# Patient Record
Sex: Male | Born: 1985 | Race: White | Hispanic: No | Marital: Married
Health system: Southern US, Community
[De-identification: ages and names within clinical notes are randomized; demographics above are authoritative.]

## PROBLEM LIST (undated history)

## (undated) DIAGNOSIS — K219 Gastro-esophageal reflux disease without esophagitis: Secondary | ICD-10-CM

## (undated) DIAGNOSIS — Z8669 Personal history of other diseases of the nervous system and sense organs: Secondary | ICD-10-CM

## (undated) HISTORY — PX: FINGER FRACTURE SURGERY: SHX638

## (undated) HISTORY — PX: PILONIDAL CYST EXCISION: SHX744

---
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
## 2006-02-11 ENCOUNTER — Emergency Department (HOSPITAL_COMMUNITY): Admission: EM | Admit: 2006-02-11 | Discharge: 2006-02-12 | Payer: Self-pay | Admitting: Emergency Medicine

## 2006-08-22 ENCOUNTER — Emergency Department (HOSPITAL_COMMUNITY): Admission: EM | Admit: 2006-08-22 | Discharge: 2006-08-22 | Payer: Self-pay | Admitting: Physician Assistant

## 2006-08-27 ENCOUNTER — Emergency Department (HOSPITAL_COMMUNITY): Admission: EM | Admit: 2006-08-27 | Discharge: 2006-08-28 | Payer: Self-pay | Admitting: Emergency Medicine

## 2006-09-05 ENCOUNTER — Emergency Department (HOSPITAL_COMMUNITY): Admission: EM | Admit: 2006-09-05 | Discharge: 2006-09-05 | Payer: Self-pay | Admitting: Emergency Medicine

## 2007-09-06 ENCOUNTER — Emergency Department (HOSPITAL_COMMUNITY): Admission: EM | Admit: 2007-09-06 | Discharge: 2007-09-06 | Payer: Self-pay | Admitting: Emergency Medicine

## 2007-12-13 ENCOUNTER — Emergency Department (HOSPITAL_COMMUNITY): Admission: EM | Admit: 2007-12-13 | Discharge: 2007-12-13 | Payer: Self-pay | Admitting: Emergency Medicine

## 2008-04-20 ENCOUNTER — Emergency Department (HOSPITAL_COMMUNITY): Admission: EM | Admit: 2008-04-20 | Discharge: 2008-04-20 | Payer: Self-pay | Admitting: Emergency Medicine

## 2008-05-11 ENCOUNTER — Emergency Department (HOSPITAL_COMMUNITY): Admission: EM | Admit: 2008-05-11 | Discharge: 2008-05-11 | Payer: Self-pay | Admitting: Emergency Medicine

## 2008-11-05 ENCOUNTER — Emergency Department (HOSPITAL_COMMUNITY): Admission: EM | Admit: 2008-11-05 | Discharge: 2008-11-05 | Payer: Self-pay | Admitting: Emergency Medicine

## 2008-12-28 ENCOUNTER — Emergency Department (HOSPITAL_COMMUNITY): Admission: EM | Admit: 2008-12-28 | Discharge: 2008-12-28 | Payer: Self-pay | Admitting: Emergency Medicine

## 2009-03-08 ENCOUNTER — Emergency Department (HOSPITAL_COMMUNITY): Admission: EM | Admit: 2009-03-08 | Discharge: 2009-03-08 | Payer: Self-pay | Admitting: Emergency Medicine

## 2009-09-26 ENCOUNTER — Emergency Department (HOSPITAL_COMMUNITY): Admission: EM | Admit: 2009-09-26 | Discharge: 2009-09-26 | Payer: Self-pay | Admitting: Emergency Medicine

## 2009-09-28 ENCOUNTER — Ambulatory Visit (HOSPITAL_COMMUNITY): Admission: RE | Admit: 2009-09-28 | Discharge: 2009-09-28 | Payer: Self-pay | Admitting: General Surgery

## 2010-07-22 LAB — BASIC METABOLIC PANEL
CO2: 25 mEq/L (ref 19–32)
Glucose, Bld: 97 mg/dL (ref 70–99)
Potassium: 4.2 mEq/L (ref 3.5–5.1)
Sodium: 139 mEq/L (ref 135–145)

## 2010-07-22 LAB — DIFFERENTIAL
Band Neutrophils: 2 % (ref 0–10)
Blasts: 0 %
Metamyelocytes Relative: 0 %
Promyelocytes Absolute: 0 %

## 2010-07-22 LAB — CBC
HCT: 45.5 % (ref 39.0–52.0)
Hemoglobin: 14.9 g/dL (ref 13.0–17.0)
MCHC: 32.8 g/dL (ref 30.0–36.0)
RDW: 13.8 % (ref 11.5–15.5)

## 2010-08-11 LAB — DIFFERENTIAL
Basophils Absolute: 0 10*3/uL (ref 0.0–0.1)
Basophils Relative: 1 % (ref 0–1)
Eosinophils Absolute: 0 10*3/uL (ref 0.0–0.7)
Eosinophils Relative: 1 % (ref 0–5)
Lymphocytes Relative: 20 % (ref 12–46)

## 2010-08-11 LAB — COMPREHENSIVE METABOLIC PANEL
AST: 20 U/L (ref 0–37)
Alkaline Phosphatase: 72 U/L (ref 39–117)
BUN: 13 mg/dL (ref 6–23)
CO2: 29 mEq/L (ref 19–32)
Chloride: 106 mEq/L (ref 96–112)
Creatinine, Ser: 0.95 mg/dL (ref 0.4–1.5)
GFR calc Af Amer: >60 mL/min (ref >60)
GFR calc non Af Amer: >60 mL/min (ref >60)
Total Bilirubin: 0.5 mg/dL (ref 0.3–1.2)

## 2010-08-11 LAB — POCT CARDIAC MARKERS: Troponin i, poc: <0.05 ng/mL (ref 0.00–0.09)

## 2010-08-11 LAB — CBC
HCT: 42.7 % (ref 39.0–52.0)
MCV: 85.4 fL (ref 78.0–100.0)
RBC: 5 MIL/uL (ref 4.22–5.81)
WBC: 6.9 10*3/uL (ref 4.0–10.5)

## 2010-08-19 LAB — BASIC METABOLIC PANEL
Chloride: 104 mEq/L (ref 96–112)
GFR calc Af Amer: >60 mL/min (ref >60)
Potassium: 3.3 mEq/L — ABNORMAL LOW (ref 3.5–5.1)
Sodium: 138 mEq/L (ref 135–145)

## 2010-08-19 LAB — URINALYSIS, ROUTINE W REFLEX MICROSCOPIC
Bilirubin Urine: NEGATIVE
Glucose, UA: NEGATIVE mg/dL
Ketones, ur: NEGATIVE mg/dL
pH: 5.5 (ref 5.0–8.0)

## 2010-08-19 LAB — URINE MICROSCOPIC-ADD ON

## 2010-08-19 LAB — DIFFERENTIAL
Basophils Absolute: 0.1 10*3/uL (ref 0.0–0.1)
Basophils Relative: 1 % (ref 0–1)
Monocytes Absolute: 0.5 10*3/uL (ref 0.1–1.0)
Neutro Abs: 4.9 10*3/uL (ref 1.7–7.7)
Neutrophils Relative %: 66 % (ref 43–77)

## 2010-08-19 LAB — CBC
Hemoglobin: 14.8 g/dL (ref 13.0–17.0)
MCHC: 33.6 g/dL (ref 30.0–36.0)
Platelets: 177 10*3/uL (ref 150–400)
RDW: 13.1 % (ref 11.5–15.5)

## 2011-05-26 ENCOUNTER — Emergency Department (HOSPITAL_COMMUNITY)
Admission: EM | Admit: 2011-05-26 | Discharge: 2011-05-26 | Disposition: A | Discharge status: Home / Self Care | Payer: Self-pay | Attending: Emergency Medicine | Admitting: Emergency Medicine

## 2011-05-26 ENCOUNTER — Encounter (HOSPITAL_COMMUNITY): Payer: Self-pay

## 2011-05-26 ENCOUNTER — Emergency Department (HOSPITAL_COMMUNITY): Payer: Self-pay

## 2011-05-26 DIAGNOSIS — J45901 Unspecified asthma with (acute) exacerbation: Secondary | ICD-10-CM | POA: Insufficient documentation

## 2011-05-26 DIAGNOSIS — F17200 Nicotine dependence, unspecified, uncomplicated: Secondary | ICD-10-CM | POA: Insufficient documentation

## 2011-05-26 DIAGNOSIS — J069 Acute upper respiratory infection, unspecified: Secondary | ICD-10-CM | POA: Insufficient documentation

## 2011-05-26 MED ORDER — PREDNISONE 20 MG PO TABS
60.0000 mg | ORAL_TABLET | Freq: Once | ORAL | Status: AC
  Administered 2011-05-26: 60 mg via ORAL
  Filled 2011-05-26: qty 3

## 2011-05-26 MED ORDER — PREDNISONE 50 MG PO TABS: ORAL_TABLET | ORAL | Status: AC

## 2011-05-26 MED ORDER — ALBUTEROL SULFATE HFA 108 (90 BASE) MCG/ACT IN AERS
2.0000 | INHALATION_SPRAY | RESPIRATORY_TRACT | Status: DC | PRN
  Administered 2011-05-26: 2 via RESPIRATORY_TRACT
  Filled 2011-05-26 (×2): qty 6.7

## 2011-05-26 MED ORDER — ALBUTEROL SULFATE (5 MG/ML) 0.5% IN NEBU
2.5000 mg | INHALATION_SOLUTION | Freq: Once | RESPIRATORY_TRACT | Status: AC
  Administered 2011-05-26: 2.5 mg via RESPIRATORY_TRACT
  Filled 2011-05-26: qty 0.5

## 2011-05-26 NOTE — ED Provider Notes (Signed)
History     CSN: [id number]  Arrival date & time 05/26/11  1722   First MD Initiated Contact with Patient 05/26/11 1747      Chief Complaint  Patient presents with  . Cough    (Consider location/radiation/quality/duration/timing/severity/associated sxs/prior treatment) Patient is a 26 y.o. male presenting with cough. The history is provided by the patient and a parent.  Cough This is a new problem. The current episode started yesterday. The problem occurs every few minutes. The problem has not changed since onset.The cough is productive of sputum. There has been no fever. Associated symptoms include wheezing. Pertinent negatives include no chills and no shortness of breath. Treatments tried: nyquil. The treatment provided no relief. He is a smoker. His past medical history is significant for bronchitis and asthma.    Past Medical History  Diagnosis Date  . Asthma     Past Surgical History  Procedure Date  . Pilonidal cyst excision     No family history on file.  History  Substance Use Topics  . Smoking status: Current Everyday Smoker  . Smokeless tobacco: Not on file  . Alcohol Use: No      Review of Systems  Constitutional: Negative for fever and chills.  Respiratory: Positive for cough and wheezing. Negative for shortness of breath.     Allergies  Review of patient's allergies indicates no known allergies.  Home Medications  No current outpatient prescriptions on file.  BP 126/91  Pulse 108  Temp(Src) 98.3 F (36.8 C) (Oral)  Resp 20  Ht 6\' 1"  (1.854 m)  Wt 189 lb (85.73 kg)  BMI 24.94 kg/m2  SpO2 99%  Physical Exam  Nursing note and vitals reviewed. Constitutional: He is oriented to person, place, and time. He appears well-developed and well-nourished. He is cooperative.  Non-toxic appearance. He does not have a sickly appearance. He does not appear ill. No distress.  HENT:  Head: Normocephalic and atraumatic.  Right Ear: External ear normal.    Left Ear: External ear normal.  Mouth/Throat: No oropharyngeal exudate.  Eyes: EOM are normal.  Neck: Normal range of motion.  Cardiovascular: Normal rate, regular rhythm, normal heart sounds and intact distal pulses.   Pulmonary/Chest: Effort normal and breath sounds normal. No accessory muscle usage. Not tachypneic. No respiratory distress. He has no decreased breath sounds. He has no wheezes. He has no rhonchi. He has no rales.  Abdominal: Soft. He exhibits no distension. There is no tenderness.  Musculoskeletal: Normal range of motion.  Neurological: He is alert and oriented to person, place, and time.  Skin: Skin is warm and dry.  Psychiatric: He has a normal mood and affect. Judgment normal.    ED Course  Procedures (including critical care time)  Labs Reviewed - No data to display No results found.   No diagnosis found.    MDM          Worthy Rancher, PA 05/26/11 1759

## 2011-05-26 NOTE — ED Provider Notes (Signed)
Medical screening examination/treatment/procedure(s) were performed by non-physician practitioner and as supervising physician I was immediately available for consultation/collaboration.  Hazely Sealey R. Terrence Wishon, MD 05/26/11 2334 

## 2011-05-26 NOTE — ED Notes (Signed)
Pt c/o cold symptoms since yesterday.  Denies fever.

## 2011-07-29 ENCOUNTER — Encounter (HOSPITAL_COMMUNITY): Payer: Self-pay | Admitting: *Deleted

## 2011-07-29 ENCOUNTER — Emergency Department (HOSPITAL_COMMUNITY)
Admission: EM | Admit: 2011-07-29 | Discharge: 2011-07-29 | Disposition: A | Discharge status: Home / Self Care | Payer: Self-pay | Attending: Emergency Medicine | Admitting: Emergency Medicine

## 2011-07-29 ENCOUNTER — Emergency Department (HOSPITAL_COMMUNITY): Payer: Self-pay

## 2011-07-29 DIAGNOSIS — J45909 Unspecified asthma, uncomplicated: Secondary | ICD-10-CM | POA: Insufficient documentation

## 2011-07-29 DIAGNOSIS — R059 Cough, unspecified: Secondary | ICD-10-CM | POA: Insufficient documentation

## 2011-07-29 DIAGNOSIS — R509 Fever, unspecified: Secondary | ICD-10-CM | POA: Insufficient documentation

## 2011-07-29 DIAGNOSIS — J189 Pneumonia, unspecified organism: Secondary | ICD-10-CM | POA: Insufficient documentation

## 2011-07-29 DIAGNOSIS — R05 Cough: Secondary | ICD-10-CM | POA: Insufficient documentation

## 2011-07-29 MED ORDER — GUAIFENESIN-CODEINE 100-10 MG/5ML PO SYRP: ORAL_SOLUTION | ORAL | Status: DC

## 2011-07-29 MED ORDER — AZITHROMYCIN 250 MG PO TABS
500.0000 mg | ORAL_TABLET | Freq: Once | ORAL | Status: AC
  Administered 2011-07-29: 500 mg via ORAL
  Filled 2011-07-29: qty 2

## 2011-07-29 MED ORDER — AZITHROMYCIN 250 MG PO TABS: ORAL_TABLET | ORAL | Status: DC

## 2011-07-29 MED ORDER — LIDOCAINE HCL (PF) 1 % IJ SOLN
INTRAMUSCULAR | Status: AC
  Administered 2011-07-29: 23:00:00
  Filled 2011-07-29: qty 5

## 2011-07-29 MED ORDER — IBUPROFEN 800 MG PO TABS
800.0000 mg | ORAL_TABLET | Freq: Once | ORAL | Status: AC
  Administered 2011-07-29: 800 mg via ORAL
  Filled 2011-07-29: qty 1

## 2011-07-29 MED ORDER — CEFTRIAXONE SODIUM 1 G IJ SOLR
1.0000 g | Freq: Once | INTRAMUSCULAR | Status: AC
  Administered 2011-07-29: 1 g via INTRAMUSCULAR
  Filled 2011-07-29: qty 10

## 2011-07-29 NOTE — ED Provider Notes (Signed)
History     CSN: [id number]  Arrival date & time 07/29/11  2056   None     Chief Complaint  Patient presents with  . Fever    today  . Cough    since Friday    (Consider location/radiation/quality/duration/timing/severity/associated sxs/prior treatment) HPI Comments: Tmax 104 today.  Cough prod of yellowish sputum.  Pt of dr. Milford Cage.  Patient is a 26 y.o. male presenting with fever and cough. The history is provided by the patient. No language interpreter was used.  Fever Primary symptoms of the febrile illness include fever and cough. The current episode started 3 to 5 days ago. This is a new problem. The problem has not changed since onset. Cough    Past Medical History  Diagnosis Date  . Asthma     Past Surgical History  Procedure Date  . Pilonidal cyst excision     History reviewed. No pertinent family history.  History  Substance Use Topics  . Smoking status: Current Everyday Smoker -- 1.5 packs/day    Types: Cigarettes  . Smokeless tobacco: Not on file  . Alcohol Use: No      Review of Systems  Constitutional: Positive for fever.  Respiratory: Positive for cough.   All other systems reviewed and are negative.    Allergies  Review of patient's allergies indicates no known allergies.  Home Medications   Current Outpatient Rx  Name Route Sig Dispense Refill  . DM-PHENYLEPHRINE-ACETAMINOPHEN 10-5-325 MG/15ML PO LIQD Oral Take 30 mLs by mouth 3 (three) times daily as needed. For cold symptoms    . AZITHROMYCIN 250 MG PO TABS  One tab po QD (initial dose given in the ED) 4 tablet 0  . GUAIFENESIN-CODEINE 100-10 MG/5ML PO SYRP  10 ml q 4-6 hrs prn cough 240 mL 0    BP 126/72  Pulse 108  Temp(Src) 100.8 F (38.2 C) (Oral)  Resp 20  Ht 6\' 1"  (1.854 m)  Wt 180 lb (81.647 kg)  BMI 23.75 kg/m2  SpO2 97%  Physical Exam  Nursing note and vitals reviewed. Constitutional: He is oriented to person, place, and time. He appears well-developed and  well-nourished.  HENT:  Head: Normocephalic and atraumatic.  Eyes: EOM are normal.  Neck: Normal range of motion.  Cardiovascular: Regular rhythm, normal heart sounds and intact distal pulses.   Pulmonary/Chest: Effort normal and breath sounds normal. No accessory muscle usage. Not tachypneic. No respiratory distress. He has no decreased breath sounds. He has no wheezes. He has no rhonchi. He has no rales. He exhibits no tenderness.  Abdominal: Soft. He exhibits no distension. There is no tenderness.  Musculoskeletal: Normal range of motion.  Neurological: He is alert and oriented to person, place, and time.  Skin: Skin is warm and dry.  Psychiatric: He has a normal mood and affect. Judgment normal.    ED Course  Procedures (including critical care time)  Labs Reviewed - No data to display Dg Chest 2 View  07/29/2011  *RADIOLOGY REPORT*  Clinical Data: Fever and cough.  CHEST - 2 VIEW  Comparison: Chest radiograph performed 05/26/2011  Findings: The lungs are well-aerated.  Mildly increased right perihilar opacity may reflect mild pneumonia.  Mild peribronchial thickening is noted.  There is no evidence of focal opacification, pleural effusion or pneumothorax.  The heart is normal in size; the mediastinal contour is within normal limits.  No acute osseous abnormalities are seen.  IMPRESSION: Mildly increased right perihilar airspace opacity raises concern for mild  pneumonia.  Would perform follow-up chest radiograph after completion of treatment for pneumonia, to ensure resolution.  Original Report Authenticated By: Tonia Ghent, M.D.     1. Community acquired pneumonia       MDM  Rocephin zithromax Tylenol or ibuprofen  For fever or discomfort.  F/u dr. Milford Cage Repeat CXR ~ 4-6 weeks        Worthy Rancher, PA 07/29/11 2243  Worthy Rancher, PA 07/29/11 2248

## 2011-07-29 NOTE — Discharge Instructions (Signed)

## 2011-07-29 NOTE — ED Notes (Signed)
Cough since Friday, Productive- yellow in color per pt, fever started today

## 2011-08-02 NOTE — ED Provider Notes (Signed)
Medical screening examination/treatment/procedure(s) were performed by non-physician practitioner and as supervising physician I was immediately available for consultation/collaboration.  Denisse Whitenack L Iliyah Bui, MD 08/02/11 0845 

## 2011-08-08 ENCOUNTER — Emergency Department (HOSPITAL_COMMUNITY)
Admission: EM | Admit: 2011-08-08 | Discharge: 2011-08-08 | Disposition: A | Discharge status: Home / Self Care | Payer: Self-pay | Attending: Emergency Medicine | Admitting: Emergency Medicine

## 2011-08-08 ENCOUNTER — Encounter (HOSPITAL_COMMUNITY): Payer: Self-pay

## 2011-08-08 ENCOUNTER — Other Ambulatory Visit: Payer: Self-pay

## 2011-08-08 ENCOUNTER — Emergency Department (HOSPITAL_COMMUNITY): Payer: Self-pay

## 2011-08-08 DIAGNOSIS — R05 Cough: Secondary | ICD-10-CM | POA: Insufficient documentation

## 2011-08-08 DIAGNOSIS — J45909 Unspecified asthma, uncomplicated: Secondary | ICD-10-CM | POA: Insufficient documentation

## 2011-08-08 DIAGNOSIS — M940 Chondrocostal junction syndrome [Tietze]: Secondary | ICD-10-CM | POA: Insufficient documentation

## 2011-08-08 DIAGNOSIS — F17200 Nicotine dependence, unspecified, uncomplicated: Secondary | ICD-10-CM | POA: Insufficient documentation

## 2011-08-08 DIAGNOSIS — R071 Chest pain on breathing: Secondary | ICD-10-CM | POA: Insufficient documentation

## 2011-08-08 DIAGNOSIS — R059 Cough, unspecified: Secondary | ICD-10-CM | POA: Insufficient documentation

## 2011-08-08 MED ORDER — IBUPROFEN 800 MG PO TABS: 800.0000 mg | ORAL_TABLET | Freq: Three times a day (TID) | ORAL | Status: AC | PRN

## 2011-08-08 MED ORDER — HYDROCODONE-ACETAMINOPHEN 5-325 MG PO TABS
1.0000 | ORAL_TABLET | Freq: Once | ORAL | Status: AC
  Administered 2011-08-08: 1 via ORAL
  Filled 2011-08-08: qty 1

## 2011-08-08 MED ORDER — IBUPROFEN 800 MG PO TABS
800.0000 mg | ORAL_TABLET | Freq: Once | ORAL | Status: AC
  Administered 2011-08-08: 800 mg via ORAL
  Filled 2011-08-08: qty 1

## 2011-08-08 MED ORDER — HYDROCODONE-ACETAMINOPHEN 5-325 MG PO TABS: 1.0000 | ORAL_TABLET | ORAL | Status: AC | PRN

## 2011-08-08 NOTE — ED Notes (Signed)
Pt presents with right sided rib pain while coughing x 1 week.

## 2011-08-08 NOTE — Discharge Instructions (Signed)
Costochondritis Costochondritis is a condition in which the tissue (cartilage) that connects your ribs with your breastbone (sternum) becomes irritated and causes chest pain.  HOME CARE  Avoid activities that wear you out.   Do not strain your ribs. Avoid activities that use your:   Chest.   Belly.   Side muscles.   Put ice on the area.   Put ice in a plastic bag.   Place a towel betwen your skin and the bag.   Leave the ice on for 15 to 20 minutes, 3 to 4 times a day.   Only take medicine as told by your doctor.  GET HELP RIGHT AWAY IF:   Your pain gets worse.   You are very uncomfortable.   You have a fever.   You have trouble breathing.   You cough up blood.   You start sweating or throwing up (vomiting).   You develop new, unexplained symptoms.  MAKE SURE YOU:   Understand these instructions.   Will watch your condition.   Will get help right away if you are not doing well or get worse.  Document Released: 10/08/2007 Document Revised: 04/10/2011 Document Reviewed: 10/08/2007 [employer] Patient Information 2012 [employer]. 

## 2011-08-08 NOTE — ED Notes (Signed)
C/o rt rib pain esp. With coughing, per pt hx of recent PNA

## 2011-08-09 NOTE — ED Provider Notes (Signed)
Medical screening examination/treatment/procedure(s) were performed by non-physician practitioner and as supervising physician I was immediately available for consultation/collaboration.   Dione Booze, MD 08/09/11 [phone]

## 2011-08-09 NOTE — ED Provider Notes (Signed)
History     CSN: [id number]  Arrival date & time 08/08/11  1643   First MD Initiated Contact with Patient 08/08/11 1701      Chief Complaint  Patient presents with  . Right sided rib pain with cough     (Consider location/radiation/quality/duration/timing/severity/associated sxs/prior treatment) HPI Comments: Patient presents with ongoing right-sided chest wall pain which is worse with coughing and with certain positions.  He was diagnosed with a lingular pneumonia last week and has completed his Zithromax.  He denies fevers or ongoing shortness of breath.  He does have an occasional cough which has been nonproductive and improving since he finished the medication.  He denies any injuries.  Patient is a 26 y.o. male presenting with chest pain. The history is provided by the patient.  Chest Pain Chest pain occurs intermittently. The chest pain is unchanged. The pain is associated with coughing (Certain positions and raising his right arm makes symptoms worse). At its most intense, the pain is at 8/10. The pain is currently at 8/10. The severity of the pain is moderate. The quality of the pain is described as stabbing. The pain does not radiate. Pertinent negatives for primary symptoms include no fever, no shortness of breath, no abdominal pain, no nausea, no vomiting and no dizziness.  Pertinent negatives for associated symptoms include no diaphoresis, no numbness, no orthopnea and no weakness. He tried nothing for the symptoms. Risk factors include no known risk factors.     Past Medical History  Diagnosis Date  . Asthma     Past Surgical History  Procedure Date  . Pilonidal cyst excision     No family history on file.  History  Substance Use Topics  . Smoking status: Current Everyday Smoker -- 1.5 packs/day    Types: Cigarettes  . Smokeless tobacco: Not on file  . Alcohol Use: No      Review of Systems  Constitutional: Negative for fever and diaphoresis.  HENT:  Negative for congestion, sore throat and neck pain.   Eyes: Negative.   Respiratory: Negative for chest tightness and shortness of breath.   Cardiovascular: Positive for chest pain. Negative for orthopnea.  Gastrointestinal: Negative for nausea, vomiting and abdominal pain.  Genitourinary: Negative.   Musculoskeletal: Negative for joint swelling and arthralgias.  Skin: Negative.  Negative for rash and wound.  Neurological: Negative for dizziness, weakness, light-headedness, numbness and headaches.  Hematological: Negative.   Psychiatric/Behavioral: Negative.     Allergies  Review of patient's allergies indicates no known allergies.  Home Medications   Current Outpatient Rx  Name Route Sig Dispense Refill  . HYDROCODONE-ACETAMINOPHEN 5-325 MG PO TABS Oral Take 1 tablet by mouth every 4 (four) hours as needed for pain. 12 tablet 0  . IBUPROFEN 800 MG PO TABS Oral Take 1 tablet (800 mg total) by mouth every 8 (eight) hours as needed for pain. 20 tablet 0    BP 133/77  Pulse 86  Temp(Src) 98.4 F (36.9 C) (Oral)  Resp 18  Ht 6\' 1"  (1.854 m)  Wt 180 lb (81.647 kg)  BMI 23.75 kg/m2  SpO2 100%  Physical Exam  Nursing note and vitals reviewed. Constitutional: He is oriented to person, place, and time. He appears well-developed and well-nourished.  HENT:  Head: Normocephalic and atraumatic.  Eyes: Conjunctivae are normal.  Neck: Normal range of motion.  Cardiovascular: Normal rate, regular rhythm, normal heart sounds and intact distal pulses.   Pulmonary/Chest: Effort normal and breath sounds normal. He has  no wheezes. He exhibits tenderness.    Abdominal: Soft. Bowel sounds are normal. There is no tenderness.  Musculoskeletal: Normal range of motion.  Neurological: He is alert and oriented to person, place, and time.  Skin: Skin is warm and dry.  Psychiatric: He has a normal mood and affect.    ED Course  Procedures (including critical care time)  Labs Reviewed - No  data to display Dg Ribs Unilateral W/chest Right  08/08/2011  *RADIOLOGY REPORT*  Clinical Data: Cough, chest pain, and the right anterior mid rib pain.  RIGHT RIBS AND CHEST - 3+ VIEW  Comparison: 07/29/2011  Findings: Heart size and vascularity are normal and the lungs are clear.  The infiltrate seen on the prior study has completely resolved.  The ribs appear normal.  IMPRESSION: Normal ribs and chest.  Complete clearing of the right lung infiltrate.  Original Report Authenticated By: Gwynn Burly, M.D.     1. Costochondritis       MDM  Imaging reviewed prior to discharge.  Patient was prescribed ibuprofen 800 mg 3 times a day, hydrocodone if needed for extra pain relief.  Also encouraged heating pad applied to the rib cage for pain relief.  Follow up with PCP if not improving.  Patient vital signs are normal with pulse of 86 oxygen level 100%, low risk for PE, pain reproducible with palpation.   Date: 08/08/2011  Rate: 79  Rhythm: normal sinus rhythm  QRS Axis: normal  Intervals: normal  ST/T Wave abnormalities: normal  Conduction Disutrbances:none  Narrative Interpretation:   Old EKG Reviewed: none available          Candis Musa, PA 08/09/11 0214

## 2011-11-07 ENCOUNTER — Encounter (HOSPITAL_COMMUNITY): Payer: Self-pay

## 2011-11-07 ENCOUNTER — Emergency Department (HOSPITAL_COMMUNITY)
Admission: EM | Admit: 2011-11-07 | Discharge: 2011-11-08 | Disposition: A | Discharge status: Home / Self Care | Payer: Self-pay | Attending: Emergency Medicine | Admitting: Emergency Medicine

## 2011-11-07 DIAGNOSIS — R05 Cough: Secondary | ICD-10-CM

## 2011-11-07 DIAGNOSIS — R51 Headache: Secondary | ICD-10-CM | POA: Insufficient documentation

## 2011-11-07 DIAGNOSIS — F17200 Nicotine dependence, unspecified, uncomplicated: Secondary | ICD-10-CM | POA: Insufficient documentation

## 2011-11-07 DIAGNOSIS — R059 Cough, unspecified: Secondary | ICD-10-CM | POA: Insufficient documentation

## 2011-11-07 MED ORDER — HYDROCOD POLST-CHLORPHEN POLST 10-8 MG/5ML PO LQCR
5.0000 mL | Freq: Once | ORAL | Status: AC
  Administered 2011-11-08: 5 mL via ORAL
  Filled 2011-11-07: qty 5

## 2011-11-07 MED ORDER — KETOROLAC TROMETHAMINE 60 MG/2ML IM SOLN
60.0000 mg | Freq: Once | INTRAMUSCULAR | Status: AC
  Administered 2011-11-08: 60 mg via INTRAMUSCULAR
  Filled 2011-11-07: qty 2

## 2011-11-07 NOTE — ED Notes (Signed)
Pt c/o persistent dry cough x 3 days, now with severe frontal headache also.

## 2011-11-08 MED ORDER — BENZONATATE 200 MG PO CAPS: 200.0000 mg | ORAL_CAPSULE | Freq: Three times a day (TID) | ORAL | Status: AC | PRN

## 2011-11-11 NOTE — ED Provider Notes (Signed)
History     CSN: [id number]  Arrival date & time 11/07/11  2316   First MD Initiated Contact with Patient 11/07/11 2331      Chief Complaint  Patient presents with  . Cough  . Headache    (Consider location/radiation/quality/duration/timing/severity/associated sxs/prior treatment) HPI Comments: Patient c/o frequent , dry cough for 3 days.  States that he has also developed a frontal headache on the evening prior to ED arrival. Gradual in onset.   States that he believes the headache is due to the frequent cough.  States he has hx of asthma but denies frequent episodes or flares.  He denies  Fever, vomiting, abd pain, chest pain or shortness of breath  Patient is a 26 y.o. male presenting with cough and headaches. The history is provided by the patient.  Cough This is a new problem. The current episode started more than 2 days ago. The problem occurs every few minutes. The problem has not changed since onset.The cough is non-productive. There has been no fever. Associated symptoms include headaches. Pertinent negatives include no chest pain, no chills, no sweats, no ear congestion, no rhinorrhea, no sore throat, no myalgias, no shortness of breath and no wheezing. Associated symptoms comments: Frontal headache. Treatments tried: albuterol inhaler. The treatment provided no relief. He is a smoker. His past medical history is significant for asthma.  Headache  Pertinent negatives include no fever, no shortness of breath, no nausea and no vomiting.    Past Medical History  Diagnosis Date  . Asthma     Past Surgical History  Procedure Date  . Pilonidal cyst excision   . Finger fracture surgery     No family history on file.  History  Substance Use Topics  . Smoking status: Current Everyday Smoker -- 1.5 packs/day    Types: Cigarettes  . Smokeless tobacco: Not on file  . Alcohol Use: No      Review of Systems  Constitutional: Negative for fever, chills, activity change and  appetite change.  HENT: Negative for congestion, sore throat, rhinorrhea, neck pain and neck stiffness.   Respiratory: Positive for cough. Negative for chest tightness, shortness of breath and wheezing.   Cardiovascular: Negative for chest pain.  Gastrointestinal: Negative for nausea, vomiting and abdominal pain.  Musculoskeletal: Negative for myalgias and arthralgias.  Skin: Negative for rash.  Neurological: Positive for headaches. Negative for dizziness, facial asymmetry, weakness and numbness.  Hematological: Negative for adenopathy.  All other systems reviewed and are negative.    Allergies  Review of patient's allergies indicates no known allergies.  Home Medications   Current Outpatient Rx  Name Route Sig Dispense Refill  . OXYCODONE-ACETAMINOPHEN 5-325 MG PO TABS Oral Take 2 tablets by mouth every 4 (four) hours as needed.    Marland Kitchen BENZONATATE 200 MG PO CAPS Oral Take 1 capsule (200 mg total) by mouth 3 (three) times daily as needed for cough. 21 capsule 0    BP 123/84  Pulse 106  Temp 99.9 F (37.7 C) (Oral)  Resp 20  Ht 6\' 1"  (1.854 m)  Wt 196 lb (88.905 kg)  BMI 25.86 kg/m2  SpO2 98%  Physical Exam  Nursing note and vitals reviewed. Constitutional: He is oriented to person, place, and time. He appears well-developed and well-nourished. No distress.  HENT:  Head: Normocephalic and atraumatic.  Mouth/Throat: Oropharynx is clear and moist.  Eyes: EOM are normal. Pupils are equal, round, and reactive to light.  Neck: Normal range of motion and phonation  normal. Neck supple. No rigidity. No Brudzinski's sign and no Kernig's sign noted.  Cardiovascular: Normal rate, regular rhythm, normal heart sounds and intact distal pulses.   No murmur heard. Pulmonary/Chest: Effort normal and breath sounds normal. No respiratory distress. He has no wheezes. He has no rales. He exhibits no tenderness.       Few scattered coarse lung sounds that clear after coughing.  No rales or  wheezes.    Abdominal: Soft. Bowel sounds are normal.  Musculoskeletal: Normal range of motion. He exhibits no edema.  Neurological: He is alert and oriented to person, place, and time. No cranial nerve deficit or sensory deficit. He exhibits normal muscle tone. Coordination and gait normal.  Reflex Scores:      Tricep reflexes are 2+ on the right side and 2+ on the left side.      Bicep reflexes are 2+ on the right side and 2+ on the left side. Skin: Skin is warm and dry.    ED Course  Procedures (including critical care time)  Labs Reviewed - No data to display   1. Headache   2. Cough       MDM   Patient is alert, vitals stable.  He is feeling better.    No meningeal signs, non-toxic appearing, no focal neuro deficits.  Pt has albuterol inhaler at home.  Sx's likely viral.  Pt agrees to close f/u with his PMD or to return here if his sx's worsen.  The patient appears reasonably screened and/or stabilized for discharge and I doubt any other medical condition or other [facility] requiring further screening, evaluation, or treatment in the ED at this time prior to discharge.   Patient / Family / Caregiver understand and agree with initial ED impression and plan with expectations set for ED visit. Pt stable in ED with no significant deterioration in condition. Pt feels improved after observation and/or treatment in ED.         Natarsha Hurwitz L. [place], [place] 11/11/11 2242

## 2011-11-12 NOTE — ED Provider Notes (Signed)
Medical screening examination/treatment/procedure(s) were performed by non-physician practitioner and as supervising physician I was immediately available for consultation/collaboration.  Gill Delrossi S. Ziyan Hillmer, MD 11/12/11 0653 

## 2011-12-01 ENCOUNTER — Encounter (HOSPITAL_COMMUNITY): Payer: Self-pay | Admitting: *Deleted

## 2011-12-01 ENCOUNTER — Emergency Department (HOSPITAL_COMMUNITY)
Admission: EM | Admit: 2011-12-01 | Discharge: 2011-12-01 | Disposition: A | Discharge status: Home / Self Care | Payer: Self-pay | Attending: Emergency Medicine | Admitting: Emergency Medicine

## 2011-12-01 ENCOUNTER — Emergency Department (HOSPITAL_COMMUNITY): Payer: Self-pay

## 2011-12-01 DIAGNOSIS — R071 Chest pain on breathing: Secondary | ICD-10-CM | POA: Insufficient documentation

## 2011-12-01 DIAGNOSIS — J040 Acute laryngitis: Secondary | ICD-10-CM | POA: Insufficient documentation

## 2011-12-01 DIAGNOSIS — J45909 Unspecified asthma, uncomplicated: Secondary | ICD-10-CM | POA: Insufficient documentation

## 2011-12-01 DIAGNOSIS — R0781 Pleurodynia: Secondary | ICD-10-CM

## 2011-12-01 DIAGNOSIS — F17200 Nicotine dependence, unspecified, uncomplicated: Secondary | ICD-10-CM | POA: Insufficient documentation

## 2011-12-01 DIAGNOSIS — J40 Bronchitis, not specified as acute or chronic: Secondary | ICD-10-CM | POA: Insufficient documentation

## 2011-12-01 MED ORDER — AZITHROMYCIN 250 MG PO TABS: ORAL_TABLET | ORAL | Status: AC

## 2011-12-01 MED ORDER — PREDNISONE 20 MG PO TABS: ORAL_TABLET | ORAL | Status: DC

## 2011-12-01 NOTE — ED Provider Notes (Signed)
History  This chart was scribed for Ward Givens, MD by Bennett Scrape. This patient was seen in room APA19/APA19 and the patient's care was started at 2:03PM.  CSN: [id number]  Arrival date & time 12/01/11  1317   First MD Initiated Contact with Patient 12/01/11 1403      Chief Complaint  Patient presents with  . Chest Pain    Patient is a 26 y.o. male presenting with cough. The history is provided by the patient. No language interpreter was used.  Cough This is a new problem. The current episode started more than 1 week ago. The problem occurs constantly. The problem has not changed since onset.The cough is non-productive. There has been no fever. Associated symptoms include chest pain, chills and shortness of breath. He has tried cough syrup for the symptoms. He is a smoker. His past medical history is significant for asthma.    Bobby Ali is a 26 y.o. male who presents to the Emergency Department complaining of 3.5 weeks of a gradual onset, non-changing, constant non-productive cough with occasional chills, SOB and 4 days of left sided CP. The chest pain only occurs with coughing an is not worse with left arm movement. Pt reports that he was seen here for the same on 11/07/11 and was discharged home with cough syrup. He denies improvement in symptoms. He denies fever, sore throat, visual disturbance,  SOB, abdominal pain, urinary symptoms, back pain, HA, and rash as associated symptoms.  He has a h/o asthma. He is a current everyday smoker but denies alcohol use.  PCP is Dr. Milford Cage.   Past Medical History  Diagnosis Date  . Asthma     Past Surgical History  Procedure Date  . Pilonidal cyst excision   . Finger fracture surgery     No family history on file.  History  Substance Use Topics  . Smoking status: Current Everyday Smoker -- 1.5 packs/day    Types: Cigarettes  . Smokeless tobacco: Not on file  . Alcohol Use: No  works in [employer]    Review of  Systems  Constitutional: Positive for chills. Negative for fever.  Respiratory: Positive for cough and shortness of breath.   Cardiovascular: Positive for chest pain. Negative for leg swelling.  Gastrointestinal: Negative for nausea, vomiting, abdominal pain and diarrhea.  Musculoskeletal: Negative for back pain.  All other systems reviewed and are negative.    Allergies  Review of patient's allergies indicates no known allergies.  Home Medications   Current Outpatient Rx  Name Route Sig Dispense Refill  . ACETAMINOPHEN 500 MG PO TABS Oral Take 1,000 mg by mouth every 6 (six) hours as needed. For pain      Triage Vitals: BP 124/75  Pulse 105  Temp 98.6 F (37 C) (Oral)  Resp 20  Ht 6\' 1"  (1.854 m)  Wt 192 lb (87.091 kg)  BMI 25.33 kg/m2  SpO2 99%  Vital signs normal except tachycardia    Physical Exam  Nursing note and vitals reviewed. Constitutional: He is oriented to person, place, and time. He appears well-developed and well-nourished.  Non-toxic appearance. He does not appear ill. No distress.  HENT:  Head: Normocephalic and atraumatic.  Right Ear: External ear normal.  Left Ear: External ear normal.  Nose: Nose normal. No mucosal edema or rhinorrhea.  Mouth/Throat: Oropharynx is clear and moist and mucous membranes are normal. No dental abscesses or uvula swelling.       Hoarse voice, poor dentition  Eyes: Conjunctivae and EOM are normal. Pupils are equal, round, and reactive to light.  Neck: Normal range of motion and full passive range of motion without pain. Neck supple. No tracheal deviation present.  Cardiovascular: Normal rate, regular rhythm and normal heart sounds.  Exam reveals no gallop and no friction rub.   No murmur heard. Pulmonary/Chest: Effort normal and breath sounds normal. No respiratory distress. He has no wheezes. He has no rhonchi. He has no rales. He exhibits no tenderness and no crepitus.  Abdominal: Soft. Normal appearance and bowel  sounds are normal. He exhibits no distension. There is no tenderness. There is no rebound and no guarding.  Musculoskeletal: Normal range of motion. He exhibits no edema and no tenderness.       Moves all extremities well.   Neurological: He is alert and oriented to person, place, and time. He has normal strength. No cranial nerve deficit.  Skin: Skin is warm, dry and intact. No rash noted. No erythema. No pallor.  Psychiatric: He has a normal mood and affect. His speech is normal and behavior is normal. His mood appears not anxious.    ED Course  Procedures (including critical care time)   DIAGNOSTIC STUDIES: Oxygen Saturation is 99% on room air, normal by my interpretation.    COORDINATION OF CARE: 2:58PM-Informed pt that chest x-ray was negative. Discussed discharge plan of antibiotics with pt at bedside and pt agreed to plan. Smoking cessation.    Labs Reviewed - No data to display Dg Chest 2 View  12/01/2011  *RADIOLOGY REPORT*  Clinical Data: Cough, chest pain  CHEST - 2 VIEW  Comparison: 07/29/2011; 05/26/2011; 12/28/2008  Findings: Grossly unchanged cardiac silhouette and mediastinal contours.  No focal parenchymal opacities.  No pleural effusion or pneumothorax.  No acute osseous abnormalities.  IMPRESSION: No acute cardiopulmonary disease.  Original Report Authenticated By: Waynard Reeds, M.D.     Date: 12/01/2011  Rate: 84  Rhythm: normal sinus rhythm  QRS Axis: normal  Intervals: normal  ST/T Wave abnormalities: normal  Conduction Disutrbances:none  Narrative Interpretation:   Old EKG Reviewed: unchanged from 08/08/2011    1. Bronchitis   2. Laryngitis   3. Pleuritic chest pain     New Prescriptions   AZITHROMYCIN (ZITHROMAX Z-PAK) 250 MG TABLET    Take 2 po the first day then once a day for the next 4 days.   PREDNISONE (DELTASONE) 20 MG TABLET    Take 3 po QD x 3d , then 2 po QD x 3d then 1 po QD x 3d    Plan discharge  Devoria Albe, MD, FACEP   MDM  I  personally performed the services described in this documentation, which was scribed in my presence. The recorded information has been reviewed and considered.      Ward Givens, MD 12/01/11 [phone]

## 2011-12-01 NOTE — ED Notes (Signed)
Pt states left CP since Thursday. States productive cough at times, yellow in color. States he has been sick since July 5th (seen here at that time)

## 2012-01-28 ENCOUNTER — Encounter (HOSPITAL_COMMUNITY): Payer: Self-pay | Admitting: Emergency Medicine

## 2012-01-28 ENCOUNTER — Emergency Department (HOSPITAL_COMMUNITY)
Admission: EM | Admit: 2012-01-28 | Discharge: 2012-01-28 | Disposition: A | Discharge status: Home / Self Care | Payer: BC Managed Care – PPO | Attending: Emergency Medicine | Admitting: Emergency Medicine

## 2012-01-28 DIAGNOSIS — K648 Other hemorrhoids: Secondary | ICD-10-CM | POA: Insufficient documentation

## 2012-01-28 DIAGNOSIS — F17200 Nicotine dependence, unspecified, uncomplicated: Secondary | ICD-10-CM | POA: Insufficient documentation

## 2012-01-28 MED ORDER — HYDROCORTISONE ACETATE 25 MG RE SUPP: 25.0000 mg | Freq: Two times a day (BID) | RECTAL | Status: DC

## 2012-01-28 NOTE — ED Notes (Signed)
Pt c/o rectal pain with bleeding since yesterday. Pt denies any injury to the area.

## 2012-01-28 NOTE — ED Notes (Signed)
Patient states he does not need anything at this time. 

## 2012-01-28 NOTE — ED Provider Notes (Signed)
History   This chart was scribed for Carleene Cooper III, MD by Gerlean Ren. This patient was seen in room APA14/APA14 and the patient's care was started at 2:42PM.   CSN: [id number]  Arrival date & time 01/28/12  1150   First MD Initiated Contact with Patient 01/28/12 1434      Chief Complaint  Patient presents with  . Rectal Pain  . Rectal Bleeding    (Consider location/radiation/quality/duration/timing/severity/associated sxs/prior treatment) Patient is a 26 y.o. male presenting with hematochezia. The history is provided by the patient. No language interpreter was used.  Rectal Bleeding   Bobby Ali is a 26 y.o. male who presents to the Emergency Department complaining of constant, sharp, non-radiating, gradually worsening rectal pain with associated hematochezia sudden onset yesterday and with no reported injury or trauma to rectum.  Pt reports noticing blood in stool but no tarry or hard stools.  Pt denies fever, sore throat, abdominal pain, urinary symptoms, difficulty ambulating, diarrhea, chest pain, back pain, and rash as associated.  Pt denies any h/o similar symptoms.  Pt has pilonidal cyst excision 3 years ago.  Pt is a current everyday smoker (1.5packs/day) and denies alcohol use.  Past Medical History  Diagnosis Date  . Asthma     Past Surgical History  Procedure Date  . Pilonidal cyst excision   . Finger fracture surgery     History reviewed. No pertinent family history.  History  Substance Use Topics  . Smoking status: Current Every Day Smoker -- 1.5 packs/day    Types: Cigarettes  . Smokeless tobacco: Not on file  . Alcohol Use: Yes      Review of Systems  Gastrointestinal: Positive for hematochezia.  All other systems reviewed and are negative.    Allergies  Review of patient's allergies indicates no known allergies.  Home Medications   Current Outpatient Rx  Name Route Sig Dispense Refill  . ACETAMINOPHEN 500 MG PO TABS Oral Take 1,000 mg  by mouth every 6 (six) hours as needed. For pain    . DIPHENHYDRAMINE HCL 25 MG PO TABS Oral Take 25 mg by mouth every 6 (six) hours as needed. Allergies      BP 123/77  Pulse 79  Temp 98.2 F (36.8 C) (Oral)  Resp 16  Ht 6\' 1"  (1.854 m)  Wt 187 lb (84.823 kg)  BMI 24.67 kg/m2  SpO2 98%  Physical Exam  Nursing note and vitals reviewed. Constitutional: He is oriented to person, place, and time. He appears well-developed.  HENT:  Head: Normocephalic and atraumatic.  Eyes: Conjunctivae normal and EOM are normal. No scleral icterus.  Neck: No tracheal deviation present.  Cardiovascular: Normal rate, regular rhythm and normal heart sounds.  Exam reveals no gallop and no friction rub.   No murmur heard. Pulmonary/Chest: Effort normal and breath sounds normal. He has no wheezes. He has no rales. He exhibits no tenderness.  Abdominal: Soft. Bowel sounds are normal. He exhibits no distension. There is no tenderness. There is no rebound.  Genitourinary:       One external hemorrhoid on left lateral position on rectum that is not currently bleeding.  Musculoskeletal: Normal range of motion. He exhibits no edema.  Neurological: He is alert and oriented to person, place, and time. Coordination normal.  Skin: Skin is warm. No rash noted. No erythema.  Psychiatric: He has a normal mood and affect. His behavior is normal.    ED Course  Procedures (including critical care time) DIAGNOSTIC STUDIES:  Oxygen Saturation is 98% on room air, normal by my interpretation.    COORDINATION OF CARE: 2:56PM-    Rx Anusol HC suppositories bid x 6 days.  F/U with Dr. Karilyn Cota for lower endoscopy.    1. Hemorrhoids, internal, with bleeding     I personally performed the services described in this documentation, which was scribed in my presence. The recorded information has been reviewed and considered.  Osvaldo Human, MD       Carleene Cooper III, MD 01/28/12 [phone]

## 2012-04-14 ENCOUNTER — Emergency Department (HOSPITAL_COMMUNITY)
Admission: EM | Admit: 2012-04-14 | Discharge: 2012-04-14 | Disposition: A | Discharge status: Home / Self Care | Payer: BC Managed Care – PPO | Attending: Emergency Medicine | Admitting: Emergency Medicine

## 2012-04-14 ENCOUNTER — Encounter (HOSPITAL_COMMUNITY): Payer: Self-pay | Admitting: Emergency Medicine

## 2012-04-14 DIAGNOSIS — F17200 Nicotine dependence, unspecified, uncomplicated: Secondary | ICD-10-CM | POA: Insufficient documentation

## 2012-04-14 DIAGNOSIS — J45909 Unspecified asthma, uncomplicated: Secondary | ICD-10-CM | POA: Insufficient documentation

## 2012-04-14 DIAGNOSIS — K625 Hemorrhage of anus and rectum: Secondary | ICD-10-CM | POA: Insufficient documentation

## 2012-04-14 DIAGNOSIS — K6289 Other specified diseases of anus and rectum: Secondary | ICD-10-CM | POA: Insufficient documentation

## 2012-04-14 DIAGNOSIS — Z9889 Other specified postprocedural states: Secondary | ICD-10-CM | POA: Insufficient documentation

## 2012-04-14 DIAGNOSIS — Z79899 Other long term (current) drug therapy: Secondary | ICD-10-CM | POA: Insufficient documentation

## 2012-04-14 DIAGNOSIS — R109 Unspecified abdominal pain: Secondary | ICD-10-CM | POA: Insufficient documentation

## 2012-04-14 LAB — CBC WITH DIFFERENTIAL/PLATELET
Basophils Absolute: 0 10*3/uL (ref 0.0–0.1)
Basophils Relative: 1 % (ref 0–1)
Eosinophils Absolute: 0.1 10*3/uL (ref 0.0–0.7)
Eosinophils Relative: 2 % (ref 0–5)
MCH: 29.1 pg (ref 26.0–34.0)
MCV: 84.8 fL (ref 78.0–100.0)
Platelets: 227 10*3/uL (ref 150–400)
RDW: 13.3 % (ref 11.5–15.5)

## 2012-04-14 LAB — BASIC METABOLIC PANEL
Calcium: 9.1 mg/dL (ref 8.4–10.5)
GFR calc Af Amer: >90 mL/min (ref >90)
GFR calc non Af Amer: >90 mL/min (ref >90)
Sodium: 136 mEq/L (ref 135–145)

## 2012-04-14 MED ORDER — TRAMADOL HCL 50 MG PO TABS: 50.0000 mg | ORAL_TABLET | Freq: Four times a day (QID) | ORAL | Status: DC | PRN

## 2012-04-14 NOTE — ED Provider Notes (Signed)
History     CSN: [id number]  Arrival date & time 04/14/12  1105   First MD Initiated Contact with Patient 04/14/12 1305      Chief Complaint  Patient presents with  . Rectal Bleeding    (Consider location/radiation/quality/duration/timing/severity/associated sxs/prior treatment) Patient is a 26 y.o. male presenting with hematochezia. The history is provided by the patient (the pt complains of rectal bleeding and rectal pain). No language interpreter was used.  Rectal Bleeding  The current episode started today. The problem occurs rarely. The problem has been resolved. The pain is mild. The stool is described as soft. There was no prior successful therapy. There was no prior unsuccessful therapy. Associated symptoms include abdominal pain and rectal pain. Pertinent negatives include no diarrhea, no hematuria, no chest pain, no headaches, no coughing and no rash.    Past Medical History  Diagnosis Date  . Asthma     Past Surgical History  Procedure Date  . Pilonidal cyst excision   . Finger fracture surgery     No family history on file.  History  Substance Use Topics  . Smoking status: Current Every Day Smoker -- 1.5 packs/day    Types: Cigarettes  . Smokeless tobacco: Not on file  . Alcohol Use: Yes      Review of Systems  Constitutional: Negative for fatigue.  HENT: Negative for congestion, sinus pressure and ear discharge.   Eyes: Negative for discharge.  Respiratory: Negative for cough.   Cardiovascular: Negative for chest pain.  Gastrointestinal: Positive for abdominal pain, hematochezia and rectal pain. Negative for diarrhea.  Genitourinary: Negative for frequency and hematuria.  Musculoskeletal: Negative for back pain.  Skin: Negative for rash.  Neurological: Negative for seizures and headaches.  Hematological: Negative.   Psychiatric/Behavioral: Negative for hallucinations.    Allergies  Review of patient's allergies indicates no known  allergies.  Home Medications   Current Outpatient Rx  Name  Route  Sig  Dispense  Refill  . ACETAMINOPHEN 500 MG PO TABS   Oral   Take 1,000 mg by mouth every 6 (six) hours as needed. For pain         . TRAMADOL HCL 50 MG PO TABS   Oral   Take 1 tablet (50 mg total) by mouth every 6 (six) hours as needed for pain.   30 tablet   0     BP 142/88  Pulse 80  Temp 98.2 F (36.8 C) (Oral)  Resp 20  Ht 6\' 1"  (1.854 m)  Wt 186 lb (84.369 kg)  BMI 24.54 kg/m2  SpO2 96%  Physical Exam  Constitutional: He is oriented to person, place, and time. He appears well-developed.  HENT:  Head: Normocephalic and atraumatic.  Eyes: Conjunctivae normal and EOM are normal. No scleral icterus.  Neck: Neck supple. No thyromegaly present.  Cardiovascular: Normal rate and regular rhythm.  Exam reveals no gallop and no friction rub.   No murmur heard. Pulmonary/Chest: No stridor. He has no wheezes. He has no rales. He exhibits no tenderness.  Abdominal: He exhibits no distension. There is no tenderness. There is no rebound.  Genitourinary:       Rectal exam.  Brown stool,  Hem pos,  Otherwise normal exam  Musculoskeletal: Normal range of motion. He exhibits no edema.  Lymphadenopathy:    He has no cervical adenopathy.  Neurological: He is oriented to person, place, and time. Coordination normal.  Skin: No rash noted. No erythema.  Psychiatric: He has a normal  mood and affect. His behavior is normal.    ED Course  Procedures (including critical care time)   Labs Reviewed  CBC WITH DIFFERENTIAL  BASIC METABOLIC PANEL   No results found.   1. Rectal bleeding       MDM          Benny Lennert, MD 04/14/12 [phone]

## 2012-04-14 NOTE — ED Notes (Signed)
Pt c/o bright red blood in stool this morning. Pt states he was seen here for same symptoms 2 months ago. Pt reports pain ever since but states this is the first episode of blood in the stool. Pt denies weakness, lightheaded, NV, chest pain, and abdominal pain.

## 2012-04-14 NOTE — ED Notes (Signed)
Pt c/o rectal pain and  blood in stool this am.

## 2012-05-13 ENCOUNTER — Emergency Department (HOSPITAL_COMMUNITY)
Admission: EM | Admit: 2012-05-13 | Discharge: 2012-05-13 | Disposition: A | Discharge status: Home / Self Care | Payer: BC Managed Care – PPO | Attending: Emergency Medicine | Admitting: Emergency Medicine

## 2012-05-13 ENCOUNTER — Encounter (HOSPITAL_COMMUNITY): Payer: Self-pay

## 2012-05-13 DIAGNOSIS — J45909 Unspecified asthma, uncomplicated: Secondary | ICD-10-CM | POA: Insufficient documentation

## 2012-05-13 DIAGNOSIS — R55 Syncope and collapse: Secondary | ICD-10-CM

## 2012-05-13 DIAGNOSIS — F17200 Nicotine dependence, unspecified, uncomplicated: Secondary | ICD-10-CM | POA: Insufficient documentation

## 2012-05-13 LAB — CBC WITH DIFFERENTIAL/PLATELET
Eosinophils Absolute: 0 10*3/uL (ref 0.0–0.7)
Lymphs Abs: 1.8 10*3/uL (ref 0.7–4.0)
MCH: 28.6 pg (ref 26.0–34.0)
Neutrophils Relative %: 76 % (ref 43–77)
Platelets: 249 10*3/uL (ref 150–400)
RBC: 5.04 MIL/uL (ref 4.22–5.81)
WBC: 9.5 10*3/uL (ref 4.0–10.5)

## 2012-05-13 LAB — BASIC METABOLIC PANEL
GFR calc Af Amer: >90 mL/min (ref >90)
GFR calc non Af Amer: >90 mL/min (ref >90)
Glucose, Bld: 88 mg/dL (ref 70–99)
Potassium: 3.9 mEq/L (ref 3.5–5.1)
Sodium: 136 mEq/L (ref 135–145)

## 2012-05-13 MED ORDER — SODIUM CHLORIDE 0.9 % IV BOLUS (SEPSIS)
1000.0000 mL | Freq: Once | INTRAVENOUS | Status: AC
  Administered 2012-05-13: 1000 mL via INTRAVENOUS

## 2012-05-13 NOTE — ED Notes (Signed)
Pt up and sitting at bedside with his wife.

## 2012-05-13 NOTE — ED Provider Notes (Signed)
History     CSN: [id number]  Arrival date & time 05/13/12  1032   First MD Initiated Contact with Patient 05/13/12 1304      Chief Complaint  Patient presents with  . Loss of Consciousness    (Consider location/radiation/quality/duration/timing/severity/associated sxs/prior treatment) HPI...... syncopal spell at work today. Patient was standing when he fainted. Did not eat breakfast.  Feels back to normal now. No previous syncopal spells. No chest pain, dyspnea, neurological deficits, fever, chills.  Severity is mild to moderate.  Past Medical History  Diagnosis Date  . Asthma     Past Surgical History  Procedure Date  . Pilonidal cyst excision   . Finger fracture surgery     No family history on file.  History  Substance Use Topics  . Smoking status: Current Every Day Smoker -- 1.5 packs/day    Types: Cigarettes  . Smokeless tobacco: Not on file  . Alcohol Use: Yes      Review of Systems  All other systems reviewed and are negative.    Allergies  Review of patient's allergies indicates no known allergies.  Home Medications   Current Outpatient Rx  Name  Route  Sig  Dispense  Refill  . ACETAMINOPHEN 500 MG PO TABS   Oral   Take 1,000 mg by mouth every 6 (six) hours as needed. For pain           BP 126/65  Pulse 71  Resp 16  Ht 6\' 1"  (1.854 m)  Wt 190 lb (86.183 kg)  BMI 25.07 kg/m2  SpO2 98%  Physical Exam  Nursing note and vitals reviewed. Constitutional: He is oriented to person, place, and time. He appears well-developed and well-nourished.  HENT:  Head: Normocephalic and atraumatic.  Eyes: Conjunctivae normal and EOM are normal. Pupils are equal, round, and reactive to light.  Neck: Normal range of motion. Neck supple.  Cardiovascular: Normal rate, regular rhythm and normal heart sounds.   Pulmonary/Chest: Effort normal and breath sounds normal.  Abdominal: Soft. Bowel sounds are normal.  Musculoskeletal: Normal range of motion.    Neurological: He is alert and oriented to person, place, and time.  Skin: Skin is warm and dry.  Psychiatric: He has a normal mood and affect.    ED Course  Procedures (including critical care time)   Labs Reviewed  CBC WITH DIFFERENTIAL  BASIC METABOLIC PANEL   No results found.   No diagnosis found.   Date: 05/13/2012  Rate: 84  Rhythm: normal sinus rhythm  QRS Axis: normal  Intervals: normal  ST/T Wave abnormalities: normal  Conduction Disutrbances: none  Narrative Interpretation: unremarkable     MDM  Patient is alert and oriented. No neurological deficits. Normal vital signs. Normal blood work and EKG.       Donnetta Hutching, MD 05/13/12 1622

## 2012-05-13 NOTE — ED Notes (Signed)
Pt states he had a headache when he went to work this morning. States he became light headed and passed out

## 2012-05-13 NOTE — ED Notes (Signed)
Pt reports he "passed out" at work this am. Pt states he has had a headache but has hx of same. Pt reports he did not eat breakfast before going to work. Pt denies medical hx other than asthma. EKG on arrival shows NSR. Pt has no tenderness to palpation around head. Denies n/v/d over past few days. Pt states he feels dizzy but has not had dizziness prior to this morning.

## 2012-07-12 ENCOUNTER — Other Ambulatory Visit (HOSPITAL_COMMUNITY): Payer: Self-pay | Admitting: Physician Assistant

## 2012-07-12 DIAGNOSIS — G43109 Migraine with aura, not intractable, without status migrainosus: Secondary | ICD-10-CM

## 2012-07-14 ENCOUNTER — Ambulatory Visit (HOSPITAL_COMMUNITY): Admission: RE | Admit: 2012-07-14 | Payer: BC Managed Care – PPO | Source: Ambulatory Visit

## 2012-07-16 ENCOUNTER — Encounter (HOSPITAL_COMMUNITY): Payer: Self-pay

## 2012-07-16 ENCOUNTER — Ambulatory Visit (HOSPITAL_COMMUNITY)
Admission: RE | Admit: 2012-07-16 | Discharge: 2012-07-16 | Disposition: A | Discharge status: Home / Self Care | Payer: BC Managed Care – PPO | Source: Ambulatory Visit | Attending: Physician Assistant | Admitting: Physician Assistant

## 2012-07-16 DIAGNOSIS — G43109 Migraine with aura, not intractable, without status migrainosus: Secondary | ICD-10-CM

## 2012-07-16 DIAGNOSIS — R51 Headache: Secondary | ICD-10-CM | POA: Insufficient documentation

## 2012-07-16 HISTORY — DX: Personal history of other diseases of the nervous system and sense organs: Z86.69

## 2012-07-16 MED ORDER — IOHEXOL 300 MG/ML  SOLN
75.0000 mL | Freq: Once | INTRAMUSCULAR | Status: AC | PRN
  Administered 2012-07-16: 75 mL via INTRAVENOUS

## 2012-08-02 ENCOUNTER — Ambulatory Visit (HOSPITAL_COMMUNITY)
Admission: RE | Admit: 2012-08-02 | Discharge: 2012-08-02 | Disposition: A | Discharge status: Home / Self Care | Payer: BC Managed Care – PPO | Source: Ambulatory Visit | Attending: Preventative Medicine | Admitting: Preventative Medicine

## 2012-08-02 DIAGNOSIS — IMO0001 Reserved for inherently not codable concepts without codable children: Secondary | ICD-10-CM | POA: Insufficient documentation

## 2012-08-02 DIAGNOSIS — M25519 Pain in unspecified shoulder: Secondary | ICD-10-CM | POA: Insufficient documentation

## 2012-08-02 DIAGNOSIS — M6281 Muscle weakness (generalized): Secondary | ICD-10-CM | POA: Insufficient documentation

## 2012-08-02 NOTE — Evaluation (Signed)
Occupational Therapy Evaluation  Patient Details  Name: Bobby Ali MRN: [id number] Date of Birth: April 19, 1986  Today's Date: 08/02/2012 Time: 3086-5784 OT Time Calculation (min): 31 min OT eval 6962-9528 31'  Visit#: 1 of 3  Re-eval: 08/06/12  Assessment Diagnosis: Rt. shoulder contusion Next MD Visit: Unknown Prior Therapy: None  Authorization: [employer] [place] [employer]  Authorization Time Period:    Authorization Visit#:   of     Past Medical History:  Past Medical History  Diagnosis Date  . Asthma   . Hx of migraine headaches 1998    15 years per patient    Past Surgical History:  Past Surgical History  Procedure Laterality Date  . Pilonidal cyst excision    . Finger fracture surgery      Subjective Symptoms/Limitations Symptoms: S: I just want to regain my arm's full abilities. Pertinent History: Bobby Ali is a 27 y/o s/p Rt arm contusion with possible tendonitis. Approx. 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 Patient is currently experiencing some discomfort and decreased strength. Dr. Germaine Pomfret has referred patient for evaluation and treatment. Special Tests: DASH 22.7 with ideal score being 0. Patient Stated Goals: To be able to return to full dutty at work. Pain Assessment Currently in Pain?: No/denies  Precautions/Restrictions  Precautions Precautions: None  Balance Screening Balance Screen Has the patient fallen in the past 6 months: No  Prior Functioning  Prior Function Level of Independence: Independent with basic ADLs Driving: Yes Vocation: Full time employment Vocation Requirements: Scrap yard Leisure: Hobbies-no  Assessment ADL/Vision/Perception ADL ADL Comments: No dificulties with bathing and dressing. Patient has pain with heavy lifting (150#-200#)  Dominant Hand: Right Vision - History Baseline Vision: Wears glasses all the time  Cognition/Observation Cognition Overall Cognitive Status: Appears within functional limits  for tasks assessed Arousal/Alertness: Awake/alert Orientation Level: Oriented X4  Sensation/Coordination/Edema Sensation Light Touch: Appears Intact Stereognosis: Appears Intact Hot/Cold: Appears Intact Proprioception: Appears Intact Additional Comments: Intially after accident, patient noted numbness and tingling when lifting heavy items.  Coordination Gross Motor Movements are Fluid and Coordinated: Yes Fine Motor Movements are Fluid and Coordinated: Yes Edema Edema: None noted  Additional Assessments RUE Assessment RUE Assessment:  (standing. IR/ER ADD) RUE AROM (degrees) Right Shoulder Flexion: 165 Degrees Right Shoulder ABduction: 175 Degrees Right Shoulder Internal Rotation: 75 Degrees Right Shoulder External Rotation: 85 Degrees RUE Strength Right Shoulder Flexion: 5/5 Right Shoulder ABduction: 5/5 Right Shoulder Internal Rotation: 5/5 Right Shoulder External Rotation: 5/5 LUE Assessment LUE Assessment:  (standing. IR/ER ADD) LUE AROM (degrees) Left Shoulder Flexion: 170 Degrees Left Shoulder ABduction: 165 Degrees Left Shoulder Internal Rotation: 75 Degrees Left Shoulder External Rotation: 85 Degrees LUE Strength Left Shoulder Flexion: 5/5 Left Shoulder ABduction: 5/5 Left Shoulder Internal Rotation: 5/5 Left Shoulder External Rotation: 5/5         Occupational Therapy Assessment and Plan OT Assessment and Plan Clinical Impression Statement: A: Patient is a 26 y/o male s/p Rt. shoulder contusion which is causing increased difficulty with work related activities.   Pt will benefit from skilled therapeutic intervention in order to improve on the following deficits: Increased fascial restricitons;Pain;Decreased strength Rehab Potential: Excellent OT Frequency: Min 3X/week OT Duration: Other (comment) (1 WEEK) OT Treatment/Interventions: Therapeutic exercise;Self-care/ADL training;Therapeutic activities;Manual therapy;Modalities;Patient/family education OT  Plan: P: MFR and manual stretching to Rt. tricep region to decrease fascial restrictions and tightness. Treatment plan: Strengthening and work hardening exercises simulating lifting heavy items in a pain free zone. Theraband,  cybex row and press, weighted box.   Goals Short Term Goals Time to Complete Short Term Goals: 2 weeks Short Term Goal 1: Patient will be educated on HEP. Short Term Goal 2: Patient will decrease fascial retrictions to trace. Short Term Goal 3: Patient will decrease pain level to 1/10 when lifting heavy items at work. Short Term Goal 4: Patient will return to prior level of independence during work activities.   Problem List Patient Active Problem List  Diagnosis  . Pain in joint, shoulder region  . Muscle weakness (generalized)    End of Session Activity Tolerance: Patient tolerated treatment well General Behavior During Session: [facility] for tasks performed Cognition: [facility] for tasks performed OT Plan of Care OT Home Exercise Plan: Chilton Si theraband exercises OT Patient Instructions: handout Consulted and Agree with Plan of Care: Patient   Limmie Patricia, OTR/L  08/02/2012, 3:24 PM  Physician Documentation Your signature is required to indicate approval of the treatment plan as stated above.  Please sign and either send electronically or make a copy of this report for your files and return this physician signed original.  Please mark one 1.__approve of plan  2. ___approve of plan with the following conditions.   ______________________________                                                          _____________________ Physician Signature                                                                                                             Date

## 2012-08-04 ENCOUNTER — Ambulatory Visit (HOSPITAL_COMMUNITY)
Admission: RE | Admit: 2012-08-04 | Discharge: 2012-08-04 | Disposition: A | Discharge status: Home / Self Care | Payer: BC Managed Care – PPO | Source: Ambulatory Visit | Attending: Preventative Medicine | Admitting: Preventative Medicine

## 2012-08-04 DIAGNOSIS — M25519 Pain in unspecified shoulder: Secondary | ICD-10-CM | POA: Insufficient documentation

## 2012-08-04 DIAGNOSIS — M6281 Muscle weakness (generalized): Secondary | ICD-10-CM | POA: Insufficient documentation

## 2012-08-04 DIAGNOSIS — IMO0001 Reserved for inherently not codable concepts without codable children: Secondary | ICD-10-CM | POA: Insufficient documentation

## 2012-08-04 NOTE — Progress Notes (Signed)
Occupational Therapy Treatment Patient Details  Name: Bobby Ali MRN: [id number] Date of Birth: 12/28/1985  Today's Date: 08/04/2012 Time: 4098-1191 OT Time Calculation (min): 41 min Manual therapy 4782-9562 10' Therapeutic exercises 1308-6578 20' Ultrasound 5' Visit#: 2 of 3  Re-eval: 08/06/12    Authorization: [employer] [place] PPO  Authorization Time Period:    Authorization Visit#:   of    Subjective S:  It mainly hurts when I lift something up to waist height to carry it.  Pain Assessment Currently in Pain?: Yes Pain Score:   1 Pain Location: Shoulder Pain Orientation: Right;Upper;Lateral  Precautions/Restrictions   n/a  Exercise/Treatments Standing Other Standing Exercises: bicep curl with 10 pounds 20 times Other Standing Exercises: shoulder flys with elbows flexed to 90 with 10 pounds 20 times. ROM / Strengthening / Isometric Strengthening Other ROM/Strengthening Exercises: cable column standing bicep curls with 6 plates 20 times      Modalities Modalities: Ultrasound Manual Therapy Manual Therapy: Myofascial release Myofascial Release: Myofascial release to right lateral upper arm at contusion site to decrease pain and restrictions.  trace restrictions felt this date. Ultrasound Ultrasound Location: right lateral upper arm Ultrasound Parameters:  3.0 mhz 1.5 intensity continuous Ultrasound Goals: Pain  Occupational Therapy Assessment and Plan OT Assessment and Plan Clinical Impression Statement: A: Added ultrasound for additional pain relief. OT Plan: P:  Add power tower for shoulder extension, abduction, bicep curl.     Goals Short Term Goals Time to Complete Short Term Goals: 2 weeks Short Term Goal 1: Patient will be educated on HEP. Short Term Goal 1 Progress: Progressing toward goal Short Term Goal 2: Patient will decrease fascial retrictions to trace. Short Term Goal 2 Progress: Progressing toward goal Short Term Goal 3: Patient will decrease pain  level to 1/10 when lifting heavy items at work. Short Term Goal 3 Progress: Progressing toward goal Short Term Goal 4: Patient will return to prior level of independence during work activities.  Short Term Goal 4 Progress: Progressing toward goal  Problem List Patient Active Problem List  Diagnosis  . Pain in joint, shoulder region  . Muscle weakness (generalized)    End of Session Activity Tolerance: Patient tolerated treatment well General Behavior During Session: [facility] for tasks performed Cognition: [facility] for tasks performed  Shirlean Mylar, OTR/L  08/04/2012, 3:17 PM

## 2012-08-06 ENCOUNTER — Inpatient Hospital Stay (HOSPITAL_COMMUNITY): Admission: RE | Admit: 2012-08-06 | Payer: BC Managed Care – PPO | Source: Ambulatory Visit

## 2012-08-13 ENCOUNTER — Ambulatory Visit (HOSPITAL_COMMUNITY)
Admission: RE | Admit: 2012-08-13 | Discharge: 2012-08-13 | Disposition: A | Discharge status: Home / Self Care | Payer: BC Managed Care – PPO | Source: Ambulatory Visit | Attending: Preventative Medicine | Admitting: Preventative Medicine

## 2012-08-13 NOTE — Progress Notes (Signed)
Occupational Therapy Treatment Patient Details  Name: Bobby Ali MRN: [id number] Date of Birth: 10/04/1985  Today's Date: 08/13/2012 Time: 4098-1191 OT Time Calculation (min): 28 min Manual Therapy 110-127 17' Remeasure 128-138 10'  Visit#: 3 of 3  Re-eval: 08/06/12    Authorization: [employer] [place] PPO  Authorization Time Period:    Authorization Visit#:   of    Subjective Symptoms/Limitations Symptoms: S:  I don't have any pain today, I don't use it much at work Special Tests: DASH 2.2 with ideal score being 0, previous score 22.7 Pain Assessment Currently in Pain?: No/denies     Manual Therapy Myofascial Release: Myofascial release to right lateral upper arm at contusion site to decrease pain and restrictions. trace to no restrictions felt this date  Occupational Therapy Assessment and Plan OT Assessment and Plan Clinical Impression Statement: A:  See progress note AROM seated right shoulder flexion 175, abduction 180, ER 90, IR 90.  pain 0/10 today.  Pt. has returned to all activities with no pain or restrictions. OT Plan: P: D/C to HEP   Goals Short Term Goals Time to Complete Short Term Goals: 2 weeks Short Term Goal 1: Patient will be educated on HEP. Short Term Goal 1 Progress: Met Short Term Goal 2: Patient will decrease fascial retrictions to trace. Short Term Goal 2 Progress: Met Short Term Goal 3: Patient will decrease pain level to 1/10 when lifting heavy items at work. Short Term Goal 3 Progress: Met Short Term Goal 4: Patient will return to prior level of independence during work activities.  Short Term Goal 4 Progress: Met  Problem List Patient Active Problem List  Diagnosis  . Pain in joint, shoulder region  . Muscle weakness (generalized)    End of Session Activity Tolerance: Patient tolerated treatment well General Behavior During Session: [facility] for tasks performed Cognition: [facility] for tasks performed  GO   Shantel Helwig L. Megean Fabio, COTA/L 08/13/2012,  3:54 PM

## 2012-10-10 ENCOUNTER — Emergency Department (HOSPITAL_COMMUNITY)
Admission: EM | Admit: 2012-10-10 | Discharge: 2012-10-10 | Disposition: A | Discharge status: Home / Self Care | Payer: BC Managed Care – PPO | Attending: Emergency Medicine | Admitting: Emergency Medicine

## 2012-10-10 ENCOUNTER — Encounter (HOSPITAL_COMMUNITY): Payer: Self-pay | Admitting: *Deleted

## 2012-10-10 DIAGNOSIS — J45909 Unspecified asthma, uncomplicated: Secondary | ICD-10-CM | POA: Insufficient documentation

## 2012-10-10 DIAGNOSIS — Z8679 Personal history of other diseases of the circulatory system: Secondary | ICD-10-CM | POA: Insufficient documentation

## 2012-10-10 DIAGNOSIS — K029 Dental caries, unspecified: Secondary | ICD-10-CM | POA: Insufficient documentation

## 2012-10-10 DIAGNOSIS — F17200 Nicotine dependence, unspecified, uncomplicated: Secondary | ICD-10-CM | POA: Insufficient documentation

## 2012-10-10 DIAGNOSIS — K047 Periapical abscess without sinus: Secondary | ICD-10-CM

## 2012-10-10 DIAGNOSIS — K089 Disorder of teeth and supporting structures, unspecified: Secondary | ICD-10-CM | POA: Insufficient documentation

## 2012-10-10 MED ORDER — BUPIVACAINE HCL (PF) 0.5 % IJ SOLN
INTRAMUSCULAR | Status: AC
  Administered 2012-10-10: 150 mg via DENTAL
  Filled 2012-10-10: qty 30

## 2012-10-10 MED ORDER — OXYCODONE-ACETAMINOPHEN 5-325 MG PO TABS
2.0000 | ORAL_TABLET | Freq: Once | ORAL | Status: AC
  Administered 2012-10-10: 2 via ORAL
  Filled 2012-10-10: qty 2

## 2012-10-10 MED ORDER — HYDROCODONE-ACETAMINOPHEN 5-325 MG PO TABS: 1.0000 | ORAL_TABLET | ORAL | Status: DC | PRN

## 2012-10-10 NOTE — ED Notes (Signed)
Pt c/o upper dental pain that started Wednesday,

## 2012-10-10 NOTE — ED Provider Notes (Signed)
History     CSN: [id number]  Arrival date & time 10/10/12  1342   First MD Initiated Contact with Patient 10/10/12 1404      Chief Complaint  Patient presents with  . Dental Pain    (Consider location/radiation/quality/duration/timing/severity/associated sxs/prior treatment) HPI Comments: 27 y/o male presents to the ED with his wife complaining of upper right dental pain over his right front tooth beginning 5 days ago on Wednesday. Pain describes as sharp and throbbing, 10/10, radiating across the front of his mouth. Denies fever or chills. Pain worse with chewing. Tried taking OTC acetaminophen without relief. Called dentist Dr. Pernell Dupre yesterday who prescribed him penicillin over the phone, however symptoms worsened today.  Patient is a 27 y.o. male presenting with tooth pain. The history is provided by the patient and the spouse.  Dental Pain Associated symptoms: no fever     Past Medical History  Diagnosis Date  . Asthma   . Hx of migraine headaches 1998    15 years per patient     Past Surgical History  Procedure Laterality Date  . Pilonidal cyst excision    . Finger fracture surgery      No family history on file.  History  Substance Use Topics  . Smoking status: Current Every Day Smoker -- 1.50 packs/day    Types: Cigarettes  . Smokeless tobacco: Not on file  . Alcohol Use: Yes      Review of Systems  Constitutional: Negative for fever and chills.  HENT: Positive for dental problem. Negative for trouble swallowing.   All other systems reviewed and are negative.    Allergies  Review of patient's allergies indicates no known allergies.  Home Medications   Current Outpatient Rx  Name  Route  Sig  Dispense  Refill  . acetaminophen (TYLENOL) 500 MG tablet   Oral   Take 1,000 mg by mouth every 6 (six) hours as needed. For pain           BP 131/80  Pulse 83  Temp(Src) 98.1 F (36.7 C) (Oral)  Resp 18  Ht 6\' 1"  (1.854 m)  Wt 210 lb (95.255 kg)   BMI 27.71 kg/m2  SpO2 100%  Physical Exam  Nursing note and vitals reviewed. Constitutional: He is oriented to person, place, and time. He appears well-developed and well-nourished. No distress.  HENT:  Head: Normocephalic and atraumatic. No trismus in the jaw.  Mouth/Throat: Uvula is midline, oropharynx is clear and moist and mucous membranes are normal. Abnormal dentition. Dental abscesses (above right front tooth) and dental caries present.    Poor dentition throughout. Multiple caries.  Eyes: Conjunctivae are normal.  Neck: Normal range of motion.  Cardiovascular: Normal rate, regular rhythm and normal heart sounds.   Pulmonary/Chest: Effort normal and breath sounds normal. No respiratory distress.  Musculoskeletal: Normal range of motion. He exhibits no edema.  Lymphadenopathy:    He has no cervical adenopathy.  Neurological: He is alert and oriented to person, place, and time.  Skin: Skin is warm and dry. He is not diaphoretic.  Psychiatric: He has a normal mood and affect. His behavior is normal.    ED Course  Procedures (including critical care time) INCISION AND DRAINAGE Performed by: Johnnette Gourd Consent: Verbal consent obtained. Risks and benefits: risks, benefits and alternatives were discussed Type: abscess  Body area: upper gingiva  Anesthesia: local infiltration  Incision was made with a scalpel.  Local anesthetic: bupivacaine 0.5% without epinephrine  Anesthetic total: 1 ml  Complexity: simple  Drainage: purulent  Drainage amount: large  Patient tolerance: Patient tolerated the procedure well with no immediate complications.    Labs Reviewed - No data to display No results found.   1. Dental abscess       MDM   Dental pain associated with dental abscess. Abscess drained with large amount of purulent material. Poor dentition throughout with multiple caries. Patient is afebrile, non toxic appearing and swallowing secretions well. Patient  will call his dentist Dr. Pernell Dupre tomorrow. He is already on penicillin which he began yesterday via over the phone from dentist. Aware to complete entire course. Will give vicodin for pain control. Patient voices understanding and is agreeable to plan.         Trevor Mace, PA-C 10/10/12 1447

## 2012-10-14 NOTE — ED Provider Notes (Signed)
Medical screening examination/treatment/procedure(s) were performed by non-physician practitioner and as supervising physician I was immediately available for consultation/collaboration.   Wayland Baik W. Safaa Stingley, MD 10/14/12 1205 

## 2012-11-22 ENCOUNTER — Emergency Department (HOSPITAL_COMMUNITY)
Admission: EM | Admit: 2012-11-22 | Discharge: 2012-11-22 | Disposition: A | Discharge status: Home / Self Care | Payer: Worker's Compensation | Attending: Emergency Medicine | Admitting: Emergency Medicine

## 2012-11-22 ENCOUNTER — Encounter (HOSPITAL_COMMUNITY): Payer: Self-pay | Admitting: *Deleted

## 2012-11-22 DIAGNOSIS — J45909 Unspecified asthma, uncomplicated: Secondary | ICD-10-CM | POA: Insufficient documentation

## 2012-11-22 DIAGNOSIS — F17200 Nicotine dependence, unspecified, uncomplicated: Secondary | ICD-10-CM | POA: Insufficient documentation

## 2012-11-22 DIAGNOSIS — Z79899 Other long term (current) drug therapy: Secondary | ICD-10-CM | POA: Insufficient documentation

## 2012-11-22 DIAGNOSIS — Z7982 Long term (current) use of aspirin: Secondary | ICD-10-CM | POA: Insufficient documentation

## 2012-11-22 DIAGNOSIS — M5416 Radiculopathy, lumbar region: Secondary | ICD-10-CM

## 2012-11-22 DIAGNOSIS — IMO0002 Reserved for concepts with insufficient information to code with codable children: Secondary | ICD-10-CM | POA: Insufficient documentation

## 2012-11-22 DIAGNOSIS — Z8669 Personal history of other diseases of the nervous system and sense organs: Secondary | ICD-10-CM | POA: Insufficient documentation

## 2012-11-22 MED ORDER — CYCLOBENZAPRINE HCL 10 MG PO TABS: 10.0000 mg | ORAL_TABLET | Freq: Two times a day (BID) | ORAL | Status: DC | PRN

## 2012-11-22 MED ORDER — OXYCODONE-ACETAMINOPHEN 5-325 MG PO TABS: 1.0000 | ORAL_TABLET | ORAL | Status: DC | PRN

## 2012-11-22 MED ORDER — NAPROXEN 500 MG PO TABS: 500.0000 mg | ORAL_TABLET | Freq: Two times a day (BID) | ORAL | Status: DC

## 2012-11-22 NOTE — ED Notes (Signed)
Pt states his back has been hurting since Friday. Went to work ok & has been hurting ever since. Pt denies any injury.

## 2012-11-22 NOTE — ED Notes (Signed)
Low back pain, exam by Iva Lento, PA

## 2012-11-22 NOTE — ED Provider Notes (Signed)
History    CSN: [id number] Arrival date & time 11/22/12  1948  First MD Initiated Contact with Patient 11/22/12 2001     Chief Complaint  Patient presents with  . Back Pain   (Consider location/radiation/quality/duration/timing/severity/associated sxs/prior Treatment) Patient is a 27 y.o. male presenting with back pain. The history is provided by the patient.  Back Pain Location:  Lumbar spine and sacro-iliac joint Quality:  Aching Radiates to:  Does not radiate Pain severity:  Moderate Pain is:  Same all the time Onset quality:  Gradual Duration:  3 days Timing:  Constant Progression:  Unchanged Chronicity:  New Relieved by:  Lying down Worsened by:  Bending, ambulation, twisting and movement Ineffective treatments:  NSAIDs and OTC medications Associated symptoms: no abdominal pain, no abdominal swelling, no bladder incontinence, no bowel incontinence, no chest pain, no dysuria, no fever, no headaches, no leg pain, no numbness, no paresthesias, no pelvic pain, no perianal numbness, no tingling, no weakness and no weight loss    Past Medical History  Diagnosis Date  . Asthma   . Hx of migraine headaches 1998    15 years per patient    Past Surgical History  Procedure Laterality Date  . Pilonidal cyst excision    . Finger fracture surgery     No family history on file. History  Substance Use Topics  . Smoking status: Current Every Day Smoker -- 1.50 packs/day    Types: Cigarettes  . Smokeless tobacco: Not on file  . Alcohol Use: Yes    Review of Systems  Constitutional: Negative for fever and weight loss.  Respiratory: Negative for shortness of breath.   Cardiovascular: Negative for chest pain.  Gastrointestinal: Negative for vomiting, abdominal pain, constipation and bowel incontinence.  Genitourinary: Negative for bladder incontinence, dysuria, hematuria, flank pain, decreased urine volume, difficulty urinating and pelvic pain.       No perineal numbness or  incontinence of urine or feces  Musculoskeletal: Positive for back pain. Negative for joint swelling.  Skin: Negative for rash.  Neurological: Negative for tingling, weakness, numbness, headaches and paresthesias.  All other systems reviewed and are negative.    Allergies  Review of patient's allergies indicates no known allergies.  Home Medications   Current Outpatient Rx  Name  Route  Sig  Dispense  Refill  . aspirin-acetaminophen-caffeine (EXCEDRIN MIGRAINE) 250-250-65 MG per tablet   Oral   Take 1-2 tablets by mouth every 6 (six) hours as needed for pain.         Marland Kitchen omeprazole (PRILOSEC) 20 MG capsule   Oral   Take 20 mg by mouth daily.          BP 153/85  Pulse 88  Temp(Src) 98.7 F (37.1 C) (Oral)  Resp 24  Ht 6\' 1"  (1.854 m)  Wt 210 lb (95.255 kg)  BMI 27.71 kg/m2  SpO2 99% Physical Exam  Nursing note and vitals reviewed. Constitutional: He is oriented to person, place, and time. He appears well-developed and well-nourished. No distress.  HENT:  Head: Normocephalic and atraumatic.  Neck: Normal range of motion. Neck supple.  Cardiovascular: Normal rate, regular rhythm, normal heart sounds and intact distal pulses.   No murmur heard. Pulmonary/Chest: Effort normal and breath sounds normal. No respiratory distress.  Musculoskeletal: He exhibits tenderness. He exhibits no edema.       Lumbar back: He exhibits tenderness and pain. He exhibits normal range of motion, no swelling, no deformity, no laceration and normal pulse.  ttp  of the bilateral lumbar paraspinal muscles and SI joints.    DP pulses are brisk and symmetrical.  Distal sensation intact.  Hip Flexors/Extensors are intact  Neurological: He is alert and oriented to person, place, and time. No cranial nerve deficit or sensory deficit. He exhibits normal muscle tone. Coordination and gait normal.  Reflex Scores:      Patellar reflexes are 2+ on the right side and 2+ on the left side.      Achilles  reflexes are 2+ on the right side and 2+ on the left side. Skin: Skin is warm and dry.    ED Course  Procedures (including critical care time) Labs Reviewed - No data to display No results found. No diagnosis found.  MDM     Patient has ttp of the bilateral lumbar paraspinal muscles and SI joints.  No focal neuro deficits on exam.  Ambulates with a steady gait.   Doubt emergent neurological or infectious process.  Pt agrees to close f/u with PMD.  VSS.  Appears stable for d/c  Kacie Huxtable L. Trisha Mangle, PA-C 11/24/12 1239

## 2012-11-24 NOTE — ED Provider Notes (Signed)
Medical screening examination/treatment/procedure(s) were performed by non-physician practitioner and as supervising physician I was immediately available for consultation/collaboration.  Lyanne Co, MD 11/24/12 [phone]

## 2013-01-01 ENCOUNTER — Encounter (HOSPITAL_COMMUNITY): Payer: Self-pay | Admitting: *Deleted

## 2013-01-01 ENCOUNTER — Emergency Department (HOSPITAL_COMMUNITY)
Admission: EM | Admit: 2013-01-01 | Discharge: 2013-01-01 | Disposition: A | Discharge status: Home / Self Care | Payer: BC Managed Care – PPO | Attending: Emergency Medicine | Admitting: Emergency Medicine

## 2013-01-01 DIAGNOSIS — R059 Cough, unspecified: Secondary | ICD-10-CM | POA: Insufficient documentation

## 2013-01-01 DIAGNOSIS — R05 Cough: Secondary | ICD-10-CM | POA: Insufficient documentation

## 2013-01-01 DIAGNOSIS — J069 Acute upper respiratory infection, unspecified: Secondary | ICD-10-CM | POA: Insufficient documentation

## 2013-01-01 DIAGNOSIS — J45909 Unspecified asthma, uncomplicated: Secondary | ICD-10-CM

## 2013-01-01 DIAGNOSIS — R6889 Other general symptoms and signs: Secondary | ICD-10-CM | POA: Insufficient documentation

## 2013-01-01 DIAGNOSIS — J45901 Unspecified asthma with (acute) exacerbation: Secondary | ICD-10-CM | POA: Insufficient documentation

## 2013-01-01 DIAGNOSIS — F17200 Nicotine dependence, unspecified, uncomplicated: Secondary | ICD-10-CM | POA: Insufficient documentation

## 2013-01-01 DIAGNOSIS — J984 Other disorders of lung: Secondary | ICD-10-CM | POA: Insufficient documentation

## 2013-01-01 DIAGNOSIS — Z8679 Personal history of other diseases of the circulatory system: Secondary | ICD-10-CM | POA: Insufficient documentation

## 2013-01-01 DIAGNOSIS — J3489 Other specified disorders of nose and nasal sinuses: Secondary | ICD-10-CM | POA: Insufficient documentation

## 2013-01-01 MED ORDER — PREDNISONE 20 MG PO TABS: 40.0000 mg | ORAL_TABLET | Freq: Every day | ORAL | Status: DC

## 2013-01-01 MED ORDER — CETIRIZINE HCL 10 MG PO TABS: 10.0000 mg | ORAL_TABLET | Freq: Every day | ORAL | Status: DC

## 2013-01-01 MED ORDER — ALBUTEROL SULFATE HFA 108 (90 BASE) MCG/ACT IN AERS
2.0000 | INHALATION_SPRAY | Freq: Once | RESPIRATORY_TRACT | Status: AC
  Administered 2013-01-01: 2 via RESPIRATORY_TRACT
  Filled 2013-01-01: qty 6.7

## 2013-01-01 NOTE — ED Notes (Signed)
Wheezing and SOB began 2 days ago.  Hx of asthma, but on no meds.

## 2013-01-01 NOTE — ED Provider Notes (Signed)
CSN: [id number]     Arrival date & time 01/01/13  1547 History  This chart was scribed for Vida Roller, MD, by Yevette Edwards, ED Scribe. This patient was seen in room APA03/APA03 and the patient's care was started at 4:13 PM.   First MD Initiated Contact with Patient 01/01/13 1606     Chief Complaint  Patient presents with  . Wheezing  . Shortness of Breath    HPI HPI Comments: Bobby Ali is a 27 y.o. male, with a h/o asthma, who presents to the Emergency Department complaining of wheezing. The pt states he has also experienced mild SOB as an associated symptom. He reports that he developed a cold last week and experienced a non-productive cough, congestion, and sneezing. The pt denies post-nasal drip, nausea, emesis, fever, or chills. He states he began to wheeze and experience SOB two days ago. The pt also states that he has not had an asthmatic attack severe enough for medication in several years.   Past Medical History  Diagnosis Date  . Asthma   . Hx of migraine headaches 1998    15 years per patient    Past Surgical History  Procedure Laterality Date  . Pilonidal cyst excision    . Finger fracture surgery     History reviewed. No pertinent family history. History  Substance Use Topics  . Smoking status: Current Every Day Smoker -- 1.50 packs/day    Types: Cigarettes  . Smokeless tobacco: Not on file  . Alcohol Use: No    Review of Systems  Constitutional: Negative for fever and chills.  HENT: Positive for congestion and sneezing. Negative for postnasal drip.   Respiratory: Positive for cough, shortness of breath and wheezing.   Gastrointestinal: Negative for nausea and vomiting.  All other systems reviewed and are negative.   Allergies  Review of patient's allergies indicates no known allergies.  Home Medications   Current Outpatient Rx  Name  Route  Sig  Dispense  Refill  . Phenyleph-CPM-DM-APAP (ALKA-SELTZER PLUS COLD & COUGH) 09-03-08-325 MG CAPS    Oral   Take 2 tablets by mouth every 6 (six) hours as needed. Cold/sinus         . cetirizine (ZYRTEC ALLERGY) 10 MG tablet   Oral   Take 1 tablet (10 mg total) by mouth daily.   30 tablet   1   . predniSONE (DELTASONE) 20 MG tablet   Oral   Take 2 tablets (40 mg total) by mouth daily.   10 tablet   0    BP 140/72  Pulse 82  Temp(Src) 98.5 F (36.9 C) (Oral)  Resp 19  Ht 6\' 1"  (1.854 m)  Wt 210 lb (95.255 kg)  BMI 27.71 kg/m2  SpO2 99%  Physical Exam  Nursing note and vitals reviewed. Constitutional: He is oriented to person, place, and time. He appears well-developed and well-nourished. No distress.  HENT:  Head: Normocephalic and atraumatic.  Right Ear: External ear normal.  Left Ear: External ear normal.  Nose: Nose normal.  Mouth/Throat: Oropharynx is clear and moist. No oropharyngeal exudate.  Bilateral cerumen impactions.   Eyes: EOM are normal.  Neck: Neck supple. No tracheal deviation present.  Cardiovascular: Normal rate, regular rhythm and normal heart sounds.   No murmur heard. Pulmonary/Chest: Effort normal. No respiratory distress. He has wheezes. He has no rales.  Mild diffuse expiratory wheezes.  No increased WOB. No rales. Speaks in full sentences. No accessory muscle use.   Abdominal:  Soft. There is no tenderness.  No HSM.   Musculoskeletal: Normal range of motion.  Lymphadenopathy:    He has no cervical adenopathy.  Neurological: He is alert and oriented to person, place, and time.  Skin: Skin is warm and dry.  Psychiatric: He has a normal mood and affect. His behavior is normal.    ED Course  Procedures (including critical care time)  DIAGNOSTIC STUDIES:  Oxygen Saturation is 99% on room air, normal by my interpretation.    COORDINATION OF CARE:  4:16 PM- Discussed treatment plan with patient, and the patient agreed to the plan.   Labs Review Labs Reviewed - No data to display Imaging Review No results found.  MDM   1. URI  (upper respiratory infection)   2. RAD (reactive airway disease)    Pt is overall well appaering, minimal wheezing, URI sx othewise, VS stable, Albuterol MDI given with spacer and instruction on use - follow up unerstanding given.  Meds given in ED:  Medications  albuterol (PROVENTIL HFA;VENTOLIN HFA) 108 (90 BASE) MCG/ACT inhaler 2 puff (2 puffs Inhalation Given 01/01/13 1638)    Discharge Medication List as of 01/01/2013  4:20 PM    START taking these medications   Details  cetirizine (ZYRTEC ALLERGY) 10 MG tablet Take 1 tablet (10 mg total) by mouth daily., Starting 01/01/2013, Until Discontinued, Print    predniSONE (DELTASONE) 20 MG tablet Take 2 tablets (40 mg total) by mouth daily., Starting 01/01/2013, Until Discontinued, Print          I personally performed the services described in this documentation, which was scribed in my presence. The recorded information has been reviewed and is accurate.        Vida Roller, MD 01/01/13 2012

## 2013-01-01 NOTE — ED Notes (Signed)
RT aware of medication order. Pt to be discharged home after receiving med, spacer and education.

## 2013-05-19 ENCOUNTER — Encounter (INDEPENDENT_AMBULATORY_CARE_PROVIDER_SITE_OTHER): Payer: Self-pay | Admitting: *Deleted

## 2013-06-06 ENCOUNTER — Encounter (HOSPITAL_COMMUNITY): Payer: Self-pay | Admitting: Pharmacy Technician

## 2013-06-06 ENCOUNTER — Ambulatory Visit (INDEPENDENT_AMBULATORY_CARE_PROVIDER_SITE_OTHER): Payer: PRIVATE HEALTH INSURANCE | Admitting: Internal Medicine

## 2013-06-06 ENCOUNTER — Encounter (INDEPENDENT_AMBULATORY_CARE_PROVIDER_SITE_OTHER): Payer: Self-pay | Admitting: *Deleted

## 2013-06-06 ENCOUNTER — Encounter (INDEPENDENT_AMBULATORY_CARE_PROVIDER_SITE_OTHER): Payer: Self-pay | Admitting: Internal Medicine

## 2013-06-06 ENCOUNTER — Other Ambulatory Visit (INDEPENDENT_AMBULATORY_CARE_PROVIDER_SITE_OTHER): Payer: Self-pay | Admitting: *Deleted

## 2013-06-06 VITALS — BP 96/62 | HR 84 | Temp 98.4°F | Ht 73.0 in | Wt 222.7 lb

## 2013-06-06 DIAGNOSIS — K219 Gastro-esophageal reflux disease without esophagitis: Secondary | ICD-10-CM

## 2013-06-06 MED ORDER — PANTOPRAZOLE SODIUM 40 MG PO TBEC: 40.0000 mg | DELAYED_RELEASE_TABLET | Freq: Every day | ORAL | Status: DC

## 2013-06-06 NOTE — Progress Notes (Signed)
Subjective:     Patient ID: Bobby Ali, male   DOB: 09/06/1985, 28 y.o.   MRN: [id number]  HPI Referred to our office  by Dr. Theodis [facility]ell [facility] at [facility]ERD/EGD Patient tells me he has esophageal pain. " Feels like something is stuck in his throat".  He denies having a sore throat. No symptoms in the last few days. He does have acid reflux. Acid reflux occurs about 5 times a week. He had been taking Omeprazole but stopped. Had been on for about 3 months.  Omeprazole really did not help.  He denies any fever. No hoarseness.  He says his mouth stays dry frequently. Occasionally he has had acid reflux bubble up into his mouth if he drinks dark sodas.  He has pain left side of his neck. He has a hacky cough off and on for years he thinks from smoking.  No foods are lodging in his esophagus. Appetite is good. No weight loss. There is no abdominal pain.  He usually has a BM about twice a day. No melena or bright red rectal bleeding.  Take Aleve on occasion for a headache but not on a daily basis.   Review of Systems see hpi Social  Married. Works at [employer]. Has 3 children in good health.  Past Medical History  Diagnosis Date  . Asthma   . Hx of migraine headaches 1998    15 years per patient    Past Surgical History  Procedure Laterality Date  . Pilonidal cyst excision    . Finger fracture surgery     No Known Allergies     Objective:   Physical Exam  Filed Vitals:   06/06/13 1438  BP: 96/62  Pulse: 84  Temp: 98.4 F (36.9 C)  Height: 6\' 1"  (1.854 m)  Weight: 222 lb 11.2 oz (101.016 kg)       Alert and oriented. Skin warm and dry. Oral mucosa is moist.  Multiple caries noted to oral pharynx . Sclera anicteric, conjunctivae is pink. Thyroid not enlarged. No cervical lymphadenopathy. Lungs clear. Heart regular rate and rhythm.  Abdomen is soft. Bowel sounds are positive. No hepatomegaly. No abdominal masses felt. No tenderness.  No edema to lower  extremities.    Assessment:    GERD. Not controlled at this time.  PUD needs to be ruled out.     Plan:    EGD with Dr. Karilyn Cotaehman. Rx for Protonix 40mg  30 minutes before breakfast. He is aware of foods to avoid.

## 2013-06-06 NOTE — Patient Instructions (Signed)
EGD with Dr. Rehman. The risks and benefits such as perforation, bleeding, and infection were reviewed with the patient and is agreeable. 

## 2013-06-14 ENCOUNTER — Telehealth (INDEPENDENT_AMBULATORY_CARE_PROVIDER_SITE_OTHER): Payer: Self-pay | Admitting: *Deleted

## 2013-06-14 NOTE — Telephone Encounter (Signed)
Patient is sch'd for EGD on 06/16/13, he wanted you to know that since Sunday he has been coughing up blood first thing in the morning but nothing after that

## 2013-06-14 NOTE — Telephone Encounter (Signed)
Patient's call returned. He may have bronchitis. He is afebrile. He would proceed with EGD in 2 days as planned.

## 2013-06-16 ENCOUNTER — Ambulatory Visit (HOSPITAL_COMMUNITY)
Admission: RE | Admit: 2013-06-16 | Discharge: 2013-06-16 | Disposition: A | Discharge status: Home / Self Care | Payer: PRIVATE HEALTH INSURANCE | Source: Ambulatory Visit | Attending: Internal Medicine | Admitting: Internal Medicine

## 2013-06-16 ENCOUNTER — Encounter (HOSPITAL_COMMUNITY): Payer: Self-pay | Admitting: *Deleted

## 2013-06-16 ENCOUNTER — Encounter (HOSPITAL_COMMUNITY)
Admission: RE | Disposition: A | Discharge status: Home / Self Care | Payer: Self-pay | Source: Ambulatory Visit | Attending: Internal Medicine

## 2013-06-16 DIAGNOSIS — K2100 Gastro-esophageal reflux disease with esophagitis, without bleeding: Secondary | ICD-10-CM | POA: Diagnosis not present

## 2013-06-16 DIAGNOSIS — R12 Heartburn: Secondary | ICD-10-CM | POA: Insufficient documentation

## 2013-06-16 DIAGNOSIS — K219 Gastro-esophageal reflux disease without esophagitis: Secondary | ICD-10-CM

## 2013-06-16 DIAGNOSIS — Z87891 Personal history of nicotine dependence: Secondary | ICD-10-CM | POA: Insufficient documentation

## 2013-06-16 HISTORY — DX: Gastro-esophageal reflux disease without esophagitis: K21.9

## 2013-06-16 HISTORY — PX: ESOPHAGOGASTRODUODENOSCOPY: SHX5428

## 2013-06-16 SURGERY — EGD (ESOPHAGOGASTRODUODENOSCOPY)
Anesthesia: Moderate Sedation

## 2013-06-16 MED ORDER — BUTAMBEN-TETRACAINE-BENZOCAINE 2-2-14 % EX AERO
INHALATION_SPRAY | CUTANEOUS | Status: DC | PRN
  Administered 2013-06-16: 2 via TOPICAL

## 2013-06-16 MED ORDER — MEPERIDINE HCL 50 MG/ML IJ SOLN
INTRAMUSCULAR | Status: AC
  Filled 2013-06-16: qty 1

## 2013-06-16 MED ORDER — PROMETHAZINE HCL 25 MG/ML IJ SOLN
INTRAMUSCULAR | Status: AC
  Filled 2013-06-16: qty 1

## 2013-06-16 MED ORDER — SODIUM CHLORIDE 0.9 % IV SOLN
INTRAVENOUS | Status: DC
  Administered 2013-06-16: 10:00:00 via INTRAVENOUS

## 2013-06-16 MED ORDER — PANTOPRAZOLE SODIUM 40 MG PO TBEC: 40.0000 mg | DELAYED_RELEASE_TABLET | Freq: Two times a day (BID) | ORAL | Status: DC

## 2013-06-16 MED ORDER — MIDAZOLAM HCL 5 MG/5ML IJ SOLN
INTRAMUSCULAR | Status: AC
  Filled 2013-06-16: qty 10

## 2013-06-16 MED ORDER — MEPERIDINE HCL 50 MG/ML IJ SOLN
INTRAMUSCULAR | Status: DC | PRN
  Administered 2013-06-16 (×2): 25 mg via INTRAVENOUS

## 2013-06-16 MED ORDER — MIDAZOLAM HCL 5 MG/5ML IJ SOLN
INTRAMUSCULAR | Status: AC
  Filled 2013-06-16: qty 5

## 2013-06-16 MED ORDER — MIDAZOLAM HCL 5 MG/5ML IJ SOLN
INTRAMUSCULAR | Status: DC | PRN
  Administered 2013-06-16: 2 mg via INTRAVENOUS
  Administered 2013-06-16: 3 mg via INTRAVENOUS
  Administered 2013-06-16: 2 mg via INTRAVENOUS

## 2013-06-16 NOTE — Discharge Instructions (Signed)
Anti-reflux measures. Increase pantoprazole to 40 mg by mouth 30 minutes before breakfast and evening meal daily for 3 months and thereafter once daily before breakfast. No driving for 24 hours. Physician will contact you with results of blood test. Gastroesophageal Reflux Disease, Adult Gastroesophageal reflux disease (GERD) happens when acid from your stomach flows up into the esophagus. When acid comes in contact with the esophagus, the acid causes soreness (inflammation) in the esophagus. Over time, GERD may create small holes (ulcers) in the lining of the esophagus. CAUSES   Increased body weight. This puts pressure on the stomach, making acid rise from the stomach into the esophagus.  Smoking. This increases acid production in the stomach.  Drinking alcohol. This causes decreased pressure in the lower esophageal sphincter (valve or ring of muscle between the esophagus and stomach), allowing acid from the stomach into the esophagus.  Late evening meals and a full stomach. This increases pressure and acid production in the stomach.  A malformed lower esophageal sphincter. Sometimes, no cause is found. SYMPTOMS   Burning pain in the lower part of the mid-chest behind the breastbone and in the mid-stomach area. This may occur twice a week or more often.  Trouble swallowing.  Sore throat.  Dry cough.  Asthma-like symptoms including chest tightness, shortness of breath, or wheezing. DIAGNOSIS  Your caregiver may be able to diagnose GERD based on your symptoms. In some cases, X-rays and other tests may be done to check for complications or to check the condition of your stomach and esophagus. TREATMENT  Your caregiver may recommend over-the-counter or prescription medicines to help decrease acid production. Ask your caregiver before starting or adding any new medicines.  HOME CARE INSTRUCTIONS   Change the factors that you can control. Ask your caregiver for guidance concerning  weight loss, quitting smoking, and alcohol consumption.  Avoid foods and drinks that make your symptoms worse, such as:  Caffeine or alcoholic drinks.  Chocolate.  Peppermint or mint flavorings.  Garlic and onions.  Spicy foods.  Citrus fruits, such as oranges, lemons, or limes.  Tomato-based foods such as sauce, chili, salsa, and pizza.  Fried and fatty foods.  Avoid lying down for the 3 hours prior to your bedtime or prior to taking a nap.  Eat small, frequent meals instead of large meals.  Wear loose-fitting clothing. Do not wear anything tight around your waist that causes pressure on your stomach.  Raise the head of your bed 6 to 8 inches with wood blocks to help you sleep. Extra pillows will not help.  Only take over-the-counter or prescription medicines for pain, discomfort, or fever as directed by your caregiver.  Do not take aspirin, ibuprofen, or other nonsteroidal anti-inflammatory drugs (NSAIDs). SEEK IMMEDIATE MEDICAL CARE IF:   You have pain in your arms, neck, jaw, teeth, or back.  Your pain increases or changes in intensity or duration.  You develop nausea, vomiting, or sweating (diaphoresis).  You develop shortness of breath, or you faint.  Your vomit is green, yellow, black, or looks like coffee grounds or blood.  Your stool is red, bloody, or black. These symptoms could be signs of other problems, such as heart disease, gastric bleeding, or esophageal bleeding. MAKE SURE YOU:   Understand these instructions.  Will watch your condition.  Will get help right away if you are not doing well or get worse.  Esophagogastroduodenoscopy Care After Refer to this sheet in the next few weeks. These instructions provide you with information on  caring for yourself after your procedure. Your caregiver may also give you more specific instructions. Your treatment has been planned according to current medical practices, but problems sometimes occur. Call your  caregiver if you have any problems or questions after your procedure.  HOME CARE INSTRUCTIONS  Do not eat or drink anything until the numbing medicine (local anesthetic) has worn off and your gag reflex has returned. You will know that the local anesthetic has worn off when you can swallow comfortably.  Do not drive for 12 hours after the procedure or as directed by your caregiver.  Only take medicines as directed by your caregiver. SEEK MEDICAL CARE IF:   You cannot stop coughing.  You are not urinating at all or less than usual. SEEK IMMEDIATE MEDICAL CARE IF:  You have difficulty swallowing.  You cannot eat or drink.  You have worsening throat or chest pain.  You have dizziness, lightheadedness, or you faint.  You have nausea or vomiting.  You have chills.  You have a fever.  You have severe abdominal pain.  You have black, tarry, or bloody stools.

## 2013-06-16 NOTE — Op Note (Signed)
EGD PROCEDURE REPORT  PATIENT:  Bobby Ali  MR#:  [id number] Birthdate:  [birth year], 28 y.o., male Endoscopist:  Dr. Malissa HippoNajeeb U. Rehman, MD Referred By:  Dr. Theodis ShoveJunell Koberlein, MD Procedure Date: 06/16/2013  Procedure:   EGD  Indications:  Patient is 28 year old Caucasian male with is history of GERD with typical and atypical symptoms unresponsive to omeprazole. Patient is undergoing diagnostic EGD.           Informed Consent:  The risks, benefits, alternatives & imponderables which include, but are not limited to, bleeding, infection, perforation, drug reaction and potential missed lesion have been reviewed.  The potential for biopsy, lesion removal, esophageal dilation, etc. have also been discussed.  Questions have been answered.  All parties agreeable.  Please see history & physical in medical record for more information.  Medications:  Demerol 50 mg IV Versed 7 mg IV Cetacaine spray topically for oropharyngeal anesthesia  Description of procedure:  The endoscope was introduced through the mouth and advanced to the second portion of the duodenum without difficulty or limitations. The mucosal surfaces were surveyed very carefully during advancement of the scope and upon withdrawal.  Findings:  Esophagus:  Mucosa of the esophagus was normal. Erosion noted at GE junction along with focal erythema and edema. No ring or stricture was present. GEJ:  38 cm Hiatus:  40 cm Stomach:  Stomach was empty and distended very well with insufflation. Folds proximal stomach are normal. Examination of mucosa at body, antrum, angularis fundus and cardia was normal but he had erythema and mucosal friability at pyloric channel but it was patent. Duodenum:  Normal bulbar and post bulbar mucosa.  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 Pyloric channel inflammation without  stricture.  Recommendations:  Anti-reflux easures reinforced. H. pylori serology. Increase pntoprazole to 40 mg by mouth twice a day for 3 months and thereafter once daily. Office visit in 6 months.  REHMAN,NAJEEB U  06/16/2013  10:18 AM  CC: Dr. Loreta AveMANN, Sharlet SalinaBENJAMIN, PA-C & Dr. Bonnetta BarryNo ref. provider found

## 2013-06-16 NOTE — H&P (Signed)
Bobby BayleyDavid R Ali is an 28 y.o. male.   Chief Complaint: Patient is  here for EGD. HPI: Patient is 28 year old Caucasian male presents with a few months history of heartburn and burning in his throat. He also started feeling of lump in his throat. He took omeprazole of months but saw some improvement. He was in the office last week and begun on pantoprazole and he is feeling some better. He is currently quit cigarette smoking. He denies  nausea vomiting dysphagia abdominal pain or melena.   Past Medical History  Diagnosis Date  . Asthma   . Hx of migraine headaches 1998    15 years per patient   . GERD (gastroesophageal reflux disease)     Past Surgical History  Procedure Laterality Date  . Pilonidal cyst excision    . Finger fracture surgery      Family History  Problem Relation Age of Onset  . Colon cancer Neg Hx    Social History:  reports that he has been smoking Cigarettes.  He has a 10.5 pack-year smoking history. He does not have any smokeless tobacco history on file. He reports that he does not drink alcohol or use illicit drugs.  Allergies: No Known Allergies  Medications Prior to Admission  Medication Sig Dispense Refill  . pantoprazole (PROTONIX) 40 MG tablet Take 40 mg by mouth daily.        No results found for this or any previous visit (from the past 48 hour(s)). No results found.  ROS  Blood pressure 133/83, pulse 96, temperature 98.3 F (36.8 C), temperature source Oral, resp. rate 19, height 6\' 1"  (1.854 m), weight 222 lb (100.699 kg), SpO2 97.00%. Physical Exam  Constitutional: He appears well-developed and well-nourished.  HENT:  Mouth/Throat: Oropharynx is clear and moist.  Eyes: Conjunctivae are normal. No scleral icterus.  Neck: No thyromegaly present.  Cardiovascular: Normal rate, regular rhythm and normal heart sounds.   No murmur heard. Respiratory: Effort normal and breath sounds normal.  GI: Soft. He exhibits no distension and no mass. There is  no tenderness.  Musculoskeletal: He exhibits no edema.  Lymphadenopathy:    He has no cervical adenopathy.  Neurological: He is alert.  Skin: Skin is warm and dry.     Assessment/Plan GERD with typical and atypical symptoms unresponsive to omeprazole. Diagnostic EGD.  REHMAN,NAJEEB U 06/16/2013, 9:56 AM

## 2013-06-17 LAB — H. PYLORI ANTIBODY, IGG: H PYLORI IGG: 0.44 {ISR}

## 2013-06-21 ENCOUNTER — Encounter (HOSPITAL_COMMUNITY): Payer: Self-pay | Admitting: Internal Medicine

## 2013-08-28 ENCOUNTER — Emergency Department (HOSPITAL_COMMUNITY): Payer: PRIVATE HEALTH INSURANCE

## 2013-08-28 ENCOUNTER — Emergency Department (HOSPITAL_COMMUNITY)
Admission: EM | Admit: 2013-08-28 | Discharge: 2013-08-28 | Disposition: A | Discharge status: Home / Self Care | Payer: PRIVATE HEALTH INSURANCE | Attending: Emergency Medicine | Admitting: Emergency Medicine

## 2013-08-28 ENCOUNTER — Encounter (HOSPITAL_COMMUNITY): Payer: Self-pay | Admitting: Emergency Medicine

## 2013-08-28 DIAGNOSIS — W268XXA Contact with other sharp object(s), not elsewhere classified, initial encounter: Secondary | ICD-10-CM | POA: Insufficient documentation

## 2013-08-28 DIAGNOSIS — J45909 Unspecified asthma, uncomplicated: Secondary | ICD-10-CM | POA: Insufficient documentation

## 2013-08-28 DIAGNOSIS — K219 Gastro-esophageal reflux disease without esophagitis: Secondary | ICD-10-CM | POA: Insufficient documentation

## 2013-08-28 DIAGNOSIS — Z9889 Other specified postprocedural states: Secondary | ICD-10-CM | POA: Insufficient documentation

## 2013-08-28 DIAGNOSIS — S61219A Laceration without foreign body of unspecified finger without damage to nail, initial encounter: Secondary | ICD-10-CM

## 2013-08-28 DIAGNOSIS — Z8679 Personal history of other diseases of the circulatory system: Secondary | ICD-10-CM | POA: Insufficient documentation

## 2013-08-28 DIAGNOSIS — S61209A Unspecified open wound of unspecified finger without damage to nail, initial encounter: Secondary | ICD-10-CM | POA: Insufficient documentation

## 2013-08-28 DIAGNOSIS — Y93G1 Activity, food preparation and clean up: Secondary | ICD-10-CM | POA: Insufficient documentation

## 2013-08-28 DIAGNOSIS — Z79899 Other long term (current) drug therapy: Secondary | ICD-10-CM | POA: Insufficient documentation

## 2013-08-28 DIAGNOSIS — F17200 Nicotine dependence, unspecified, uncomplicated: Secondary | ICD-10-CM | POA: Insufficient documentation

## 2013-08-28 DIAGNOSIS — Y929 Unspecified place or not applicable: Secondary | ICD-10-CM | POA: Insufficient documentation

## 2013-08-28 MED ORDER — LIDOCAINE HCL (PF) 2 % IJ SOLN
INTRAMUSCULAR | Status: AC
  Administered 2013-08-28: 20:00:00
  Filled 2013-08-28: qty 10

## 2013-08-28 MED ORDER — HYDROCODONE-ACETAMINOPHEN 5-325 MG PO TABS: 1.0000 | ORAL_TABLET | ORAL | Status: DC | PRN

## 2013-08-28 MED ORDER — IBUPROFEN 800 MG PO TABS: 800.0000 mg | ORAL_TABLET | Freq: Three times a day (TID) | ORAL | Status: DC

## 2013-08-28 NOTE — ED Notes (Signed)
Pt reports was washing dishes when a glass broke. Pt reports cut fifth finger on right hand. Moderate laceration noted to lateral nail bed. Cns intact. No active bleeding noted. Distal pulses strong and intact.

## 2013-08-28 NOTE — ED Provider Notes (Signed)
CSN: [id number]     Arrival date & time 4/26/[id number]  1839 History   First MD Initiated Contact with Patient 08/28/13 1914     Chief Complaint  Patient presents with  . Finger Injury     (Consider location/radiation/quality/duration/timing/severity/associated sxs/prior Treatment) Patient is a 28 y.o. male presenting with skin laceration. The history is provided by the patient.  Laceration Location:  Hand Hand laceration location:  R finger Length (cm):  2.2 Depth:  Cutaneous Bleeding: controlled   Time since incident:  2 hours Laceration mechanism:  Broken glass Pain details:    Quality:  Throbbing   Severity:  Moderate   Progression:  Unchanged Foreign body present:  No foreign bodies Relieved by:  Nothing Worsened by:  Movement Ineffective treatments:  None tried Tetanus status:  Up to date   Past Medical History  Diagnosis Date  . Asthma   . Hx of migraine headaches 1998    15 years per patient   . GERD (gastroesophageal reflux disease)    Past Surgical History  Procedure Laterality Date  . Pilonidal cyst excision    . Finger fracture surgery    . Esophagogastroduodenoscopy N/A 06/16/2013    Procedure: ESOPHAGOGASTRODUODENOSCOPY (EGD);  Surgeon: Malissa HippoNajeeb U Rehman, MD;  Location: [facility];  Service: Endoscopy;  Laterality: N/A;  250-moved to 1155 Ann to notify pt   Family History  Problem Relation Age of Onset  . Colon cancer Neg Hx    History  Substance Use Topics  . Smoking status: Current Every Day Smoker -- 1.50 packs/day for 7 years    Types: Cigarettes  . Smokeless tobacco: Not on file     Comment: x 7 rs.   . Alcohol Use: No    Review of Systems  Constitutional: Negative for activity change.       All ROS Neg except as noted in HPI  HENT: Negative for nosebleeds.   Eyes: Negative for photophobia and discharge.  Respiratory: Negative for cough, shortness of breath and wheezing.   Cardiovascular: Negative for chest pain and palpitations.   Gastrointestinal: Negative for abdominal pain and blood in stool.  Genitourinary: Negative for dysuria, frequency and hematuria.  Musculoskeletal: Negative for arthralgias, back pain and neck pain.  Skin: Negative.   Neurological: Negative for dizziness, seizures and speech difficulty.  Psychiatric/Behavioral: Negative for hallucinations and confusion.      Allergies  Review of patient's allergies indicates no known allergies.  Home Medications   Prior to Admission medications   Medication Sig Start Date End Date Taking? Authorizing Provider  pantoprazole (PROTONIX) 40 MG tablet Take 1 tablet (40 mg total) by mouth 2 (two) times daily before a meal. 06/16/13   Malissa HippoNajeeb U Rehman, MD   BP 134/71  Pulse 75  Temp(Src) 98.2 F (36.8 C) (Oral)  Resp 18  Ht 6\' 1"  (1.854 m)  Wt 210 lb (95.255 kg)  BMI 27.71 kg/m2  SpO2 100% Physical Exam  Nursing note and vitals reviewed. Constitutional: He is oriented to person, place, and time. He appears well-developed and well-nourished.  Non-toxic appearance.  HENT:  Head: Normocephalic.  Right Ear: Tympanic membrane and external ear normal.  Left Ear: Tympanic membrane and external ear normal.  Eyes: EOM and lids are normal. Pupils are equal, round, and reactive to light.  Neck: Normal range of motion. Neck supple. Carotid bruit is not present.  Cardiovascular: Normal rate, regular rhythm, normal heart sounds, intact distal pulses and normal pulses.   Pulmonary/Chest: Breath sounds normal.  No respiratory distress.  Abdominal: Soft. Bowel sounds are normal. There is no tenderness. There is no guarding.  Musculoskeletal: Normal range of motion. He exhibits tenderness.  Flap Laceration distal tip of the right fifth finger. No bone or tendon involvement.  Lymphadenopathy:       Head (right side): No submandibular adenopathy present.       Head (left side): No submandibular adenopathy present.    He has no cervical adenopathy.  Neurological: He  is alert and oriented to person, place, and time. He has normal strength. No cranial nerve deficit or sensory deficit.  Skin: Skin is warm and dry.  Psychiatric: He has a normal mood and affect. His speech is normal.    ED Course  LACERATION REPAIR Date/Time: 08/28/2013 7:43 PM Performed by: Kathie DikeBRYANT, Adarrius Graeff M Authorized by: Kathie DikeBRYANT, Mintie Witherington M Consent: Verbal consent obtained. Risks and benefits: risks, benefits and alternatives were discussed Consent given by: patient Patient understanding: patient states understanding of the procedure being performed Patient identity confirmed: arm band Time out: Immediately prior to procedure a "time out" was called to verify the correct patient, procedure, equipment, support staff and site/side marked as required. Body area: upper extremity Location details: right small finger Laceration length: 2.2 cm Foreign bodies: no foreign bodies Tendon involvement: none Nerve involvement: none Vascular damage: no Anesthesia: digital block Local anesthetic: lidocaine 2% without epinephrine Patient sedated: no Preparation: Patient was prepped and draped in the usual sterile fashion. Irrigation solution: saline Amount of cleaning: standard Debridement: none Degree of undermining: none Skin closure: 4-0 nylon Number of sutures: 6 Technique: simple Approximation: close Approximation difficulty: simple Dressing: gauze roll Patient tolerance: Patient tolerated the procedure well with no immediate complications.   (including critical care time) Labs Review Labs Reviewed - No data to display  Imaging Review Dg Finger Little Right  08/28/2013   CLINICAL DATA:  Laceration of the right fifth finger  EXAM: RIGHT LITTLE FINGER 2+V  COMPARISON:  None.  FINDINGS: Three views of the right fifth finger reveal soft tissue disruption over the lateral aspect of the finger tip. No foreign bodies are demonstrated. The distal phalanx appears intact. The interphalangeal  joints also appear normal.  IMPRESSION: There is no evidence of retained foreign body within the soft tissues of the right finger tip. No fracture is demonstrated.   Electronically Signed   By: Mychal  Swazil[place]   On: 08/28/2013 19:05     EKG Interpretation None      MDM X-ray of the right fifth finger is negative for any bone involvement, or any foreign body. The laceration to the fifth finger was repaired with 6 sutures. Patient advised to keep the wound clean and dry, and have sutures removed in 7 or 8 days. He will return to the emergency department sooner if any signs of infection.    Final diagnoses:  None    *I have reviewed nursing notes, vital signs, and all appropriate lab and imaging results for this patient.Kathie Dike**    Trestan Vahle M Bayle Calvo, PA-C 08/28/13 1946

## 2013-08-28 NOTE — ED Notes (Signed)
Dressing applied to R 5th digit.

## 2013-08-28 NOTE — ED Provider Notes (Signed)
Medical screening examination/treatment/procedure(s) were performed by non-physician practitioner and as supervising physician I was immediately available for consultation/collaboration.   EKG Interpretation None        Bobby LennertJoseph L Georgina Krist, MD 08/28/13 2255

## 2013-08-28 NOTE — Discharge Instructions (Signed)
Your wound was repaired with 6 stitches. Please keep your hand clean and dry. May need to use a glove or bag if you're getting in the shower, to protect the wound. Please see your primary physician, or return to the emergency department if any unusual swelling, pus drainage, unusual pain, or signs of infection. Please have your sutures removed in 7-10  Stitches, Staples, or Skin Adhesive Strips  Stitches (sutures), staples, and skin adhesive strips hold the skin together as it heals. They will usually be in place for 7 days or less. HOME CARE  Wash your hands with soap and water before and after you touch your wound.  Only take medicine as told by your doctor.  Cover your wound only if your doctor told you to. Otherwise, leave it open to air.  Do not get your stitches wet or dirty. If they get dirty, dab them gently with a clean washcloth. Wet the washcloth with soapy water. Do not rub. Pat them dry gently.  Do not put medicine or medicated cream on your stitches unless your doctor told you to.  Do not take out your own stitches or staples. Skin adhesive strips will fall off by themselves.  Do not pick at the wound. Picking can cause an infection.  Do not miss your follow-up appointment.  If you have problems or questions, call your doctor. GET HELP RIGHT AWAY IF:   You have a temperature by mouth above 102 F (38.9 C), not controlled by medicine.  You have chills.  You have redness or pain around your stitches.  There is puffiness (swelling) around your stitches.  You notice fluid (drainage) from your stitches.  There is a bad smell coming from your wound. MAKE SURE YOU:  Understand these instructions.  Will watch your condition.  Will get help if you are not doing well or get worse. Document Released: 02/16/2009 Document Revised: 07/14/2011 Document Reviewed: 02/16/2009 [employer] Patient Information 2014 [place].

## 2013-08-31 ENCOUNTER — Other Ambulatory Visit (HOSPITAL_COMMUNITY): Payer: Self-pay | Admitting: Physician Assistant

## 2013-08-31 DIAGNOSIS — R042 Hemoptysis: Secondary | ICD-10-CM

## 2013-09-02 ENCOUNTER — Ambulatory Visit (HOSPITAL_COMMUNITY): Admission: RE | Admit: 2013-09-02 | Payer: PRIVATE HEALTH INSURANCE | Source: Ambulatory Visit

## 2013-09-06 ENCOUNTER — Encounter (HOSPITAL_COMMUNITY): Payer: Self-pay | Admitting: Emergency Medicine

## 2013-09-06 ENCOUNTER — Emergency Department (HOSPITAL_COMMUNITY)
Admission: EM | Admit: 2013-09-06 | Discharge: 2013-09-06 | Disposition: A | Discharge status: Home / Self Care | Payer: PRIVATE HEALTH INSURANCE | Attending: Emergency Medicine | Admitting: Emergency Medicine

## 2013-09-06 DIAGNOSIS — K219 Gastro-esophageal reflux disease without esophagitis: Secondary | ICD-10-CM | POA: Insufficient documentation

## 2013-09-06 DIAGNOSIS — Z4802 Encounter for removal of sutures: Secondary | ICD-10-CM

## 2013-09-06 DIAGNOSIS — J45909 Unspecified asthma, uncomplicated: Secondary | ICD-10-CM | POA: Insufficient documentation

## 2013-09-06 DIAGNOSIS — Z8669 Personal history of other diseases of the nervous system and sense organs: Secondary | ICD-10-CM | POA: Insufficient documentation

## 2013-09-06 DIAGNOSIS — F17200 Nicotine dependence, unspecified, uncomplicated: Secondary | ICD-10-CM | POA: Insufficient documentation

## 2013-09-06 NOTE — Discharge Instructions (Signed)
Suture Removal, Care After Refer to this sheet in the next few weeks. These instructions provide you with information on caring for yourself after your procedure. Your health care provider may also give you more specific instructions. Your treatment has been planned according to current medical practices, but problems sometimes occur. Call your health care provider if you have any problems or questions after your procedure. WHAT TO EXPECT AFTER THE PROCEDURE After your stitches (sutures) are removed, it is typical to have the following:  Some discomfort and swelling in the wound area.  Slight redness in the area. HOME CARE INSTRUCTIONS   If you have skin adhesive strips over the wound area, do not take the strips off. They will fall off on their own in a few days. If the strips remain in place after 14 days, you may remove them.  Change any bandages (dressings) at least once a day or as directed by your health care provider. If the bandage sticks, soak it off with warm, soapy water.  Apply cream or ointment only as directed by your health care provider. If using cream or ointment, wash the area with soap and water 2 times a day to remove all the cream or ointment. Rinse off the soap and pat the area dry with a clean towel.  Keep the wound area dry and clean. If the bandage becomes wet or dirty, or if it develops a bad smell, change it as soon as possible.  Continue to protect the wound from injury.  Use sunscreen when out in the sun. New scars become sunburned easily. SEEK MEDICAL CARE IF:  You have increasing redness, swelling, or pain in the wound.  You see pus coming from the wound.  You have a fever.  You notice a bad smell coming from the wound or dressing.  Your wound breaks open (edges not staying together). Document Released: 01/14/2001 Document Revised: 02/09/2013 Document Reviewed: 12/01/2012 Exit[employer] Patient Information 2014 [employer].  

## 2013-09-06 NOTE — ED Notes (Signed)
Pt had sutures placed last Sunday in fifth finger on right hand. Pt here for suture removal. Pt denies any fevers. nad noted.

## 2013-09-08 NOTE — ED Provider Notes (Signed)
Medical screening examination/treatment/procedure(s) were performed by non-physician practitioner and as supervising physician I was immediately available for consultation/collaboration.   EKG Interpretation None        Joya Gaskinsonald W Antwain Caliendo, MD 09/08/13 1427

## 2013-09-08 NOTE — ED Provider Notes (Signed)
CSN: [id number]     Arrival date & time 09/06/13  1648 History   First MD Initiated Contact with Patient 09/06/13 1752     Chief Complaint  Patient presents with  . Suture / Staple Removal     (Consider location/radiation/quality/duration/timing/severity/associated sxs/prior Treatment) Patient is a 28 y.o. male presenting with suture removal. The history is provided by the patient.  Suture / Staple Removal This is a new problem. Episode onset: 9 days ago. The problem has been gradually improving. Pertinent negatives include no arthralgias, chills, fever, joint swelling, numbness, rash or weakness. Nothing aggravates the symptoms. The treatment provided significant relief.   Patient return here today requesting suture removal.  He was seen here on 08/28/13 for laceration to the right fifth finger.  He states the wound is feeling better and he has no complaints at this time.   Past Medical History  Diagnosis Date  . Asthma   . Hx of migraine headaches 1998    15 years per patient   . GERD (gastroesophageal reflux disease)    Past Surgical History  Procedure Laterality Date  . Pilonidal cyst excision    . Finger fracture surgery    . Esophagogastroduodenoscopy N/A 06/16/2013    Procedure: ESOPHAGOGASTRODUODENOSCOPY (EGD);  Surgeon: Malissa HippoNajeeb U Rehman, MD;  [facility];  Service: Endoscopy;  Laterality: N/A;  250-moved to 1155 Ann to notify pt   Family History  Problem Relation Age of Onset  . Colon cancer Neg Hx    History  Substance Use Topics  . Smoking status: Current Every Day Smoker -- 1.50 packs/day for 7 years    Types: Cigarettes  . Smokeless tobacco: Not on file     Comment: x 7 rs.   . Alcohol Use: No    Review of Systems  Constitutional: Negative for fever and chills.  Musculoskeletal: Negative for arthralgias, back pain and joint swelling.  Skin: Positive for wound. Negative for rash.       Laceration with sutures to right 5th finger  Neurological:  Negative for dizziness, weakness and numbness.  Hematological: Does not bruise/bleed easily.  All other systems reviewed and are negative.     Allergies  Review of patient's allergies indicates no known allergies.  Home Medications   Prior to Admission medications   Medication Sig Start Date End Date Taking? Authorizing Provider  HYDROcodone-acetaminophen (NORCO/VICODIN) 5-325 MG per tablet Take 1 tablet by mouth every 4 (four) hours as needed for moderate pain. 08/28/13   Kathie DikeHobson M Bryant, PA-C  ibuprofen (ADVIL,MOTRIN) 800 MG tablet Take 1 tablet (800 mg total) by mouth 3 (three) times daily. 08/28/13   Kathie DikeHobson M Bryant, PA-C  pantoprazole (PROTONIX) 40 MG tablet Take 1 tablet (40 mg total) by mouth 2 (two) times daily before a meal. 06/16/13   Malissa HippoNajeeb U Rehman, MD   BP 140/86  Pulse 87  Temp(Src) 98.3 F (36.8 C) (Oral)  Resp 16  Ht 6\' 1"  (1.854 m)  Wt 210 lb (95.255 kg)  BMI 27.71 kg/m2  SpO2 100% Physical Exam  Nursing note and vitals reviewed. Constitutional: He is oriented to person, place, and time. He appears well-developed and well-nourished. No distress.  HENT:  Head: Normocephalic and atraumatic.  Cardiovascular: Normal rate, regular rhythm, normal heart sounds and intact distal pulses.   No murmur heard. Pulmonary/Chest: Effort normal and breath sounds normal. No respiratory distress.  Musculoskeletal: He exhibits no edema and no tenderness.  Neurological: He is alert and oriented to person, place,  and time. He exhibits normal muscle tone. Coordination normal.  Skin: Skin is warm. Laceration noted.  Laceration to the distal end of the right fifth finger.  Suture line intact.  Wound appears to be healing well.  No erythema, drainage or edema.  Pt has full ROM of the finger.    ED Course  Procedures (including critical care time) Labs Review Labs Reviewed - No data to display  Imaging Review No results found.   EKG Interpretation None      MDM   Final  diagnoses:  Visit for suture removal    Sutures removed by nursing w/o difficulty, remains NV intact, no clinical signs of infection appears to be healing well.  Pt agrees to return here if needed.      Shaquasha Gerstel L. Kaydenn Mclear, PA-C 09/08/13 1238

## 2013-12-21 ENCOUNTER — Encounter (INDEPENDENT_AMBULATORY_CARE_PROVIDER_SITE_OTHER): Payer: Self-pay | Admitting: *Deleted

## 2014-01-16 ENCOUNTER — Ambulatory Visit (INDEPENDENT_AMBULATORY_CARE_PROVIDER_SITE_OTHER): Payer: Self-pay | Admitting: Internal Medicine

## 2014-01-18 ENCOUNTER — Encounter (INDEPENDENT_AMBULATORY_CARE_PROVIDER_SITE_OTHER): Payer: Self-pay | Admitting: *Deleted

## 2014-01-18 ENCOUNTER — Telehealth (INDEPENDENT_AMBULATORY_CARE_PROVIDER_SITE_OTHER): Payer: Self-pay | Admitting: *Deleted

## 2014-01-18 NOTE — Telephone Encounter (Signed)
Bobby Ali No Showed for his apt on 01/16/14 with Dorene Ar, NP. A No Show letter has been mailed.

## 2014-03-26 ENCOUNTER — Emergency Department (HOSPITAL_COMMUNITY)
Admission: EM | Admit: 2014-03-26 | Discharge: 2014-03-26 | Disposition: A | Discharge status: Home / Self Care | Payer: PRIVATE HEALTH INSURANCE | Attending: Emergency Medicine | Admitting: Emergency Medicine

## 2014-03-26 ENCOUNTER — Encounter (HOSPITAL_COMMUNITY): Payer: Self-pay | Admitting: Emergency Medicine

## 2014-03-26 DIAGNOSIS — Z79899 Other long term (current) drug therapy: Secondary | ICD-10-CM | POA: Diagnosis not present

## 2014-03-26 DIAGNOSIS — J45909 Unspecified asthma, uncomplicated: Secondary | ICD-10-CM | POA: Diagnosis not present

## 2014-03-26 DIAGNOSIS — K088 Other specified disorders of teeth and supporting structures: Secondary | ICD-10-CM | POA: Insufficient documentation

## 2014-03-26 DIAGNOSIS — K029 Dental caries, unspecified: Secondary | ICD-10-CM

## 2014-03-26 DIAGNOSIS — Z8679 Personal history of other diseases of the circulatory system: Secondary | ICD-10-CM | POA: Diagnosis not present

## 2014-03-26 DIAGNOSIS — K0381 Cracked tooth: Secondary | ICD-10-CM | POA: Diagnosis not present

## 2014-03-26 DIAGNOSIS — Z720 Tobacco use: Secondary | ICD-10-CM | POA: Diagnosis not present

## 2014-03-26 DIAGNOSIS — K219 Gastro-esophageal reflux disease without esophagitis: Secondary | ICD-10-CM | POA: Diagnosis not present

## 2014-03-26 MED ORDER — HYDROCODONE-ACETAMINOPHEN 5-325 MG PO TABS: 1.0000 | ORAL_TABLET | ORAL | Status: DC | PRN

## 2014-03-26 MED ORDER — AMOXICILLIN 250 MG PO CAPS
500.0000 mg | ORAL_CAPSULE | Freq: Once | ORAL | Status: AC
  Administered 2014-03-26: 500 mg via ORAL
  Filled 2014-03-26: qty 2

## 2014-03-26 MED ORDER — AMOXICILLIN 500 MG PO CAPS: 500.0000 mg | ORAL_CAPSULE | Freq: Three times a day (TID) | ORAL | Status: AC

## 2014-03-26 MED ORDER — HYDROCODONE-ACETAMINOPHEN 5-325 MG PO TABS
1.0000 | ORAL_TABLET | Freq: Once | ORAL | Status: AC
  Administered 2014-03-26: 1 via ORAL
  Filled 2014-03-26: qty 1

## 2014-03-26 NOTE — Discharge Instructions (Signed)
Dental Pain A tooth ache may be caused by cavities (tooth decay). Cavities expose the nerve of the tooth to air and hot or cold temperatures. It may come from an infection or abscess (also called a boil or furuncle) around your tooth. It is also often caused by dental caries (tooth decay). This causes the pain you are having. DIAGNOSIS  Your caregiver can diagnose this problem by exam. TREATMENT   If caused by an infection, it may be treated with medications which kill germs (antibiotics) and pain medications as prescribed by your caregiver. Take medications as directed.  Only take over-the-counter or prescription medicines for pain, discomfort, or fever as directed by your caregiver.  Whether the tooth ache today is caused by infection or dental disease, you should see your dentist as soon as possible for further care. SEEK MEDICAL CARE IF: The exam and treatment you received today has been provided on an emergency basis only. This is not a substitute for complete medical or dental care. If your problem worsens or new problems (symptoms) appear, and you are unable to meet with your dentist, call or return to this location. SEEK IMMEDIATE MEDICAL CARE IF:   You have a fever.  You develop redness and swelling of your face, jaw, or neck.  You are unable to open your mouth.  You have severe pain uncontrolled by pain medicine. MAKE SURE YOU:   Understand these instructions.  Will watch your condition.  Will get help right away if you are not doing well or get worse. Document Released: 04/21/2005 Document Revised: 07/14/2011 Document Reviewed: 12/08/2007 [facility] Patient Information 2015 [place], [place]. This information is not intended to replace advice given to you by your health care provider. Make sure you discuss any questions you have with your health care provider.   Complete your entire course of antibiotics as prescribed.  You  may use the hydrocodone for pain relief but do not  drive within 4 hours of taking as this will make you drowsy.    You may use warm salt water swish and spit treatment or half peroxide and water swish and spit after meals to help keep these cavity sites cleaned out .  Call the dentist listed above for further management of your symptoms.

## 2014-03-26 NOTE — ED Notes (Signed)
Pt c/o left sided dental pain since 4am. Been taking Ibuprofen with last dose at 4. Also took some of his wife's Prednisone at 6 with no relief.

## 2014-03-26 NOTE — ED Provider Notes (Signed)
CSN: [id number]     Arrival date & time 03/26/14  2000 History   First MD Initiated Contact with Patient 03/26/14 2023     Chief Complaint  Patient presents with  . Dental Pain     (Consider location/radiation/quality/duration/timing/severity/associated sxs/prior Treatment) The history is provided by the patient.   Bobby Ali is a 28 y.o. male presenting with a 1 day history of dental pain and gingival swelling.   The patient has a history of multiple areas of dental decay, with 2 teeth causing severe increased pain since last night.  There has been no fevers, chills, nausea or vomiting, also no complaint of difficulty swallowing, although chewing makes pain worse.  The patient has tried ibuprofen and prednisone without relief of symptoms.         Past Medical History  Diagnosis Date  . Asthma   . Hx of migraine headaches 1998    15 years per patient   . GERD (gastroesophageal reflux disease)    Past Surgical History  Procedure Laterality Date  . Pilonidal cyst excision    . Finger fracture surgery    . Esophagogastroduodenoscopy N/A 06/16/2013    Procedure: ESOPHAGOGASTRODUODENOSCOPY (EGD);  Surgeon: Malissa HippoNajeeb U Rehman, MD;  [facility];  Service: Endoscopy;  Laterality: N/A;  250-moved to 1155 Ann to notify pt   Family History  Problem Relation Age of Onset  . Colon cancer Neg Hx    History  Substance Use Topics  . Smoking status: Current Every Day Smoker -- 1.00 packs/day for 7 years    Types: Cigarettes  . Smokeless tobacco: Not on file     Comment: x 7 rs.   . Alcohol Use: Yes     Comment: occasional    Review of Systems  Constitutional: Negative for fever.  HENT: Positive for dental problem. Negative for facial swelling and sore throat.   Respiratory: Negative for shortness of breath.   Musculoskeletal: Negative for neck pain and neck stiffness.      Allergies  Review of patient's allergies indicates no known allergies.  Home Medications     Prior to Admission medications   Medication Sig Start Date End Date Taking? Authorizing Provider  amoxicillin (AMOXIL) 500 MG capsule Take 1 capsule (500 mg total) by mouth 3 (three) times daily. 03/26/14 04/05/14  Burgess AmorJulie Lena Fieldhouse, PA-C  HYDROcodone-acetaminophen (NORCO/VICODIN) 5-325 MG per tablet Take 1 tablet by mouth every 4 (four) hours as needed. 03/26/14   Burgess AmorJulie Noreen Mackintosh, PA-C  ibuprofen (ADVIL,MOTRIN) 800 MG tablet Take 1 tablet (800 mg total) by mouth 3 (three) times daily. 08/28/13   Kathie DikeHobson M Bryant, PA-C  pantoprazole (PROTONIX) 40 MG tablet Take 1 tablet (40 mg total) by mouth 2 (two) times daily before a meal. 06/16/13   Malissa HippoNajeeb U Rehman, MD   BP 140/98 mmHg  Pulse 83  Temp(Src) 99.2 F (37.3 C) (Oral)  Resp 20  Ht 6\' 1"  (1.854 m)  Wt 210 lb (95.255 kg)  BMI 27.71 kg/m2  SpO2 100% Physical Exam  Constitutional: He is oriented to person, place, and time. He appears well-developed and well-nourished. No distress.  HENT:  Head: Normocephalic and atraumatic.  Right Ear: Tympanic membrane and external ear normal.  Left Ear: Tympanic membrane and external ear normal.  Mouth/Throat: Oropharynx is clear and moist and mucous membranes are normal. No oral lesions. No trismus in the jaw. Dental caries present. No dental abscesses.    Deep cavity and fracture noted left lower 2nd molar, posterior  tooth. Gingival tenderness with erythema, no edema.  Generalized poor dentition with multiple areas of cavities and enamel erosion.  Sublingual space is soft.    Eyes: Conjunctivae are normal.  Neck: Normal range of motion. Neck supple.  Cardiovascular: Normal rate and normal heart sounds.   Pulmonary/Chest: Effort normal.  Abdominal: He exhibits no distension.  Musculoskeletal: Normal range of motion.  Lymphadenopathy:    He has no cervical adenopathy.  Neurological: He is alert and oriented to person, place, and time.  Skin: Skin is warm and dry. No erythema.  Psychiatric: He has a normal mood  and affect.    ED Course  Procedures (including critical care time) Labs Review Labs Reviewed - No data to display  Imaging Review No results found.   EKG Interpretation None      MDM   Final diagnoses:  Pain due to dental caries    Suspect early infection given chronic nature of cavities, but new onset pain.  He was placed on amoxil, hydrocodone, dental referrals given.  The patient appears reasonably screened and/or stabilized for discharge and I doubt any other medical condition or other [facility] requiring further screening, evaluation, or treatment in the ED at this time prior to discharge.     Burgess AmorJulie Margherita Collyer, PA-C 03/26/14 2059  Benny LennertJoseph L Zammit, MD 03/26/14 [phone]

## 2014-08-07 ENCOUNTER — Encounter (HOSPITAL_COMMUNITY): Payer: Self-pay | Admitting: Emergency Medicine

## 2014-08-07 ENCOUNTER — Emergency Department (HOSPITAL_COMMUNITY)
Admission: EM | Admit: 2014-08-07 | Discharge: 2014-08-07 | Disposition: A | Discharge status: Home / Self Care | Payer: Self-pay | Attending: Emergency Medicine | Admitting: Emergency Medicine

## 2014-08-07 DIAGNOSIS — J45909 Unspecified asthma, uncomplicated: Secondary | ICD-10-CM | POA: Insufficient documentation

## 2014-08-07 DIAGNOSIS — Z720 Tobacco use: Secondary | ICD-10-CM | POA: Insufficient documentation

## 2014-08-07 DIAGNOSIS — K219 Gastro-esophageal reflux disease without esophagitis: Secondary | ICD-10-CM | POA: Insufficient documentation

## 2014-08-07 DIAGNOSIS — Z8679 Personal history of other diseases of the circulatory system: Secondary | ICD-10-CM | POA: Insufficient documentation

## 2014-08-07 DIAGNOSIS — K047 Periapical abscess without sinus: Secondary | ICD-10-CM | POA: Insufficient documentation

## 2014-08-07 MED ORDER — AMOXICILLIN 500 MG PO CAPS: 500.0000 mg | ORAL_CAPSULE | Freq: Three times a day (TID) | ORAL | Status: DC

## 2014-08-07 MED ORDER — HYDROCODONE-ACETAMINOPHEN 5-325 MG PO TABS
1.0000 | ORAL_TABLET | Freq: Once | ORAL | Status: AC
  Administered 2014-08-07: 1 via ORAL
  Filled 2014-08-07: qty 1

## 2014-08-07 MED ORDER — NAPROXEN 500 MG PO TABS: 500.0000 mg | ORAL_TABLET | Freq: Two times a day (BID) | ORAL | Status: DC

## 2014-08-07 NOTE — Discharge Instructions (Signed)
Take the medication as directed and follow up with a dentist as soon as possible.   Dental Abscess A dental abscess is a collection of infected fluid (pus) from a bacterial infection in the inner part of the tooth (pulp). It usually occurs at the end of the tooth's root.  CAUSES   Severe tooth decay.  Trauma to the tooth that allows bacteria to enter into the pulp, such as a broken or chipped tooth. SYMPTOMS   Severe pain in and around the infected tooth.  Swelling and redness around the abscessed tooth or in the mouth or face.  Tenderness.  Pus drainage.  Bad breath.  Bitter taste in the mouth.  Difficulty swallowing.  Difficulty opening the mouth.  Nausea.  Vomiting.  Chills.  Swollen neck glands. DIAGNOSIS   A medical and dental history will be taken.  An examination will be performed by tapping on the abscessed tooth.  X-rays may be taken of the tooth to identify the abscess. TREATMENT The goal of treatment is to eliminate the infection. You may be prescribed antibiotic medicine to stop the infection from spreading. A root canal may be performed to save the tooth. If the tooth cannot be saved, it may be pulled (extracted) and the abscess may be drained.  HOME CARE INSTRUCTIONS  Only take over-the-counter or prescription medicines for pain, fever, or discomfort as directed by your caregiver.  Rinse your mouth (gargle) often with salt water ( tsp salt in 8 oz [250 ml] of warm water) to relieve pain or swelling.  Do not drive after taking pain medicine (narcotics).  Do not apply heat to the outside of your face.  Return to your dentist for further treatment as directed. SEEK MEDICAL CARE IF:  Your pain is not helped by medicine.  Your pain is getting worse instead of better. SEEK IMMEDIATE MEDICAL CARE IF:  You have a fever or persistent symptoms for more than 2-3 days.  You have a fever and your symptoms suddenly get worse.  You have chills or a  very bad headache.  You have problems breathing or swallowing.  You have trouble opening your mouth.  You have swelling in the neck or around the eye. Document Released: 04/21/2005 Document Revised: 01/14/2012 Document Reviewed: 07/30/2010 [facility]Exit[facility] Patient Information 2015 [place]Care, [place]LL[place]. This information is not intended to replace advice given to you by your health care provider. Make sure you discuss any questions you have with your health care provider.

## 2014-08-07 NOTE — ED Notes (Signed)
Pt states he has been having left lower dental pain with swelling all weekend.

## 2014-08-07 NOTE — ED Provider Notes (Signed)
CSN: [id number]     Arrival date & time 08/07/14  1040 History  This chart was scribed for No att. providers found by Tonye Royalty, ED Scribe. This patient was seen in room [facility]FT24/[facility]24 and the patient's care was started at 11:45 AM.    Chief Complaint  Patient presents with  . Dental Pain   Patient is a 29 y.o. male presenting with tooth pain. The history is provided by the patient. No language interpreter was used.  Dental Pain Location:  Lower Lower teeth location:  19/LL 1st molar Severity:  Moderate Onset quality:  Sudden Duration:  3 days Timing:  Constant Progression:  Worsening Chronicity:  New Context: abscess   Relieved by:  Nothing Worsened by:  Nothing tried Ineffective treatments:  NSAIDs Associated symptoms: facial swelling   Associated symptoms: no fever   Risk factors: lack of dental care     HPI Comments: Bobby Ali is a 29 y.o. male who presents to the Emergency Department complaining of left lower dental pain with onset 3 days ago. He reports some facial swelling and pain. He has been using Ibuprofen without improvement. He states he has had dental problems and dental abscesses before and does not have a dentist at this time. He reports history of asthma but no other problems. He denies fever, chills, swollen glands, ear ache, or sore throat.  Past Medical History  Diagnosis Date  . Asthma   . Hx of migraine headaches 1998    15 years per patient   . GERD (gastroesophageal reflux disease)    Past Surgical History  Procedure Laterality Date  . Pilonidal cyst excision    . Finger fracture surgery    . Esophagogastroduodenoscopy N/A 06/16/2013    Procedure: ESOPHAGOGASTRODUODENOSCOPY (EGD);  Surgeon: Malissa Hippo, MD;  [facility];  Service: Endoscopy;  Laterality: N/A;  250-moved to 1155 Ann to notify pt   Family History  Problem Relation Age of Onset  . Colon cancer Neg Hx    History  Substance Use Topics  . Smoking status: Current  Every Day Smoker -- 1.00 packs/day for 7 years    Types: Cigarettes  . Smokeless tobacco: Not on file     Comment: x 7 rs.   . Alcohol Use: Yes     Comment: occasional    Review of Systems  Constitutional: Negative for fever and chills.  HENT: Positive for dental problem and facial swelling. Negative for ear pain and sore throat.   All other systems reviewed and are negative.     Allergies  Review of patient's allergies indicates no known allergies.  Home Medications   Prior to Admission medications   Medication Sig Start Date End Date Taking? Authorizing Provider  amoxicillin (AMOXIL) 500 MG capsule Take 1 capsule (500 mg total) by mouth 3 (three) times daily. 08/07/14   Hope Orlene Och, NP  naproxen (NAPROSYN) 500 MG tablet Take 1 tablet (500 mg total) by mouth 2 (two) times daily. 08/07/14   Hope Orlene Och, NP  pantoprazole (PROTONIX) 40 MG tablet Take 1 tablet (40 mg total) by mouth 2 (two) times daily before a meal. Patient not taking: Reported on 08/07/2014 06/16/13   Malissa Hippo, MD   BP 141/91 mmHg  Pulse 88  Temp(Src) 98 F (36.7 C) (Oral)  Resp 16  Ht  (1.854 m)  Wt 215 lb (97.523 kg)  BMI 28.37 kg/m2  SpO2 100% Physical Exam  Constitutional: He is oriented to person,  place, and time. He appears well-developed and well-nourished.  HENT:  Head: Normocephalic and atraumatic.  Mouth/Throat:    First molar lower left Left lower first molar decayed and there is swelling and infection of the gums surrounding the tooth He has minimal swelling to the left side of his face  Eyes: Conjunctivae are normal.  Neck: Normal range of motion. Neck supple.  Anterior cervical nodes that are enlarged  Cardiovascular: Normal rate, regular rhythm and normal heart sounds.   No murmur heard. Pulmonary/Chest: Effort normal and breath sounds normal. No respiratory distress. He has no wheezes. He has no rales.  Musculoskeletal: Normal range of motion.  Lymphadenopathy:    He has no  cervical adenopathy.  Neurological: He is alert and oriented to person, place, and time.  Skin: Skin is warm and dry.  Psychiatric: He has a normal mood and affect. His behavior is normal.  Nursing note and vitals reviewed.   ED Course  Procedures (including critical care time)  DIAGNOSTIC STUDIES: Oxygen Saturation is 100% on room air, normal by my interpretation.    COORDINATION OF CARE: 11:49 AM Discussed treatment plan with patient at beside, including antibiotic and follow up with dentist. The patient agrees with the plan and has no further questions at this time.   MDM  29 y.o. male with dental pain due to caries and early abscess. Will treat with antibiotics and NSAIDS and patient is to follow up with a dentist as soon as possible. Discussed with the patient and all questioned fully answered. He will return if any problems arise.   Final diagnoses:  Dental abscess   I personally performed the services described in this documentation, which was scribed in my presence. The recorded information has been reviewed and is accurate.   [address]Hope Mountain PlainsM Neese, TexasNP 08/10/14 [id number]  Lorre NickAnthony Allen, MD 08/11/14 [phone]

## 2014-08-07 NOTE — ED Notes (Signed)
Dental pain lt mandibular molar for 2 days. Dental Caries present

## 2015-01-27 ENCOUNTER — Encounter (HOSPITAL_COMMUNITY): Payer: Self-pay | Admitting: *Deleted

## 2015-01-27 ENCOUNTER — Emergency Department (HOSPITAL_COMMUNITY)
Admission: EM | Admit: 2015-01-27 | Discharge: 2015-01-27 | Disposition: A | Discharge status: Home / Self Care | Payer: Self-pay | Attending: Emergency Medicine | Admitting: Emergency Medicine

## 2015-01-27 DIAGNOSIS — Z720 Tobacco use: Secondary | ICD-10-CM | POA: Insufficient documentation

## 2015-01-27 DIAGNOSIS — J45909 Unspecified asthma, uncomplicated: Secondary | ICD-10-CM | POA: Insufficient documentation

## 2015-01-27 DIAGNOSIS — J029 Acute pharyngitis, unspecified: Secondary | ICD-10-CM | POA: Insufficient documentation

## 2015-01-27 DIAGNOSIS — K219 Gastro-esophageal reflux disease without esophagitis: Secondary | ICD-10-CM | POA: Insufficient documentation

## 2015-01-27 DIAGNOSIS — Z8679 Personal history of other diseases of the circulatory system: Secondary | ICD-10-CM | POA: Insufficient documentation

## 2015-01-27 DIAGNOSIS — Z791 Long term (current) use of non-steroidal anti-inflammatories (NSAID): Secondary | ICD-10-CM | POA: Insufficient documentation

## 2015-01-27 DIAGNOSIS — Z792 Long term (current) use of antibiotics: Secondary | ICD-10-CM | POA: Insufficient documentation

## 2015-01-27 LAB — RAPID STREP SCREEN (MED CTR MEBANE ONLY): STREPTOCOCCUS, GROUP A SCREEN (DIRECT): NEGATIVE

## 2015-01-27 MED ORDER — AMOXICILLIN 500 MG PO CAPS: 500.0000 mg | ORAL_CAPSULE | Freq: Three times a day (TID) | ORAL | Status: DC

## 2015-01-27 NOTE — ED Notes (Signed)
Sore throat and bilateral ear pain began Thursday. Pt denies fever.

## 2015-01-27 NOTE — ED Provider Notes (Signed)
CSN: [id number]     Arrival date & time 01/27/15  1009 History   First MD Initiated Contact with Patient 01/27/15 1033     Chief Complaint  Patient presents with  . Sore Throat     (Consider location/radiation/quality/duration/timing/severity/associated sxs/prior Treatment) Patient is a 29 y.o. Ali presenting with pharyngitis. The history is provided by the patient. No language interpreter was used.  Sore Throat This is a new problem. The current episode started in the past 7 days. The problem occurs constantly. The problem has been gradually worsening. Associated symptoms include a sore throat. Nothing aggravates the symptoms. He has tried nothing for the symptoms. The treatment provided moderate relief.    Past Medical History  Diagnosis Date  . Asthma   . Hx of migraine headaches 1998    15 years per patient   . GERD (gastroesophageal reflux disease)    Past Surgical History  Procedure Laterality Date  . Pilonidal cyst excision    . Finger fracture surgery    . Esophagogastroduodenoscopy N/A 06/16/2013    Procedure: ESOPHAGOGASTRODUODENOSCOPY (EGD);  Surgeon: Malissa Hippo, MD;  [facility];  Service: Endoscopy;  Laterality: N/A;  250-moved to 1155 Ann to notify pt   Family History  Problem Relation Age of Onset  . Colon cancer Neg Hx    Social History  Substance Use Topics  . Smoking status: Current Every Day Smoker -- 1.00 packs/day for 7 years    Types: Cigarettes  . Smokeless tobacco: None     Comment: x 7 rs.   . Alcohol Use: Yes     Comment: occasional    Review of Systems  HENT: Positive for sore throat.   All other systems reviewed and are negative.     Allergies  Review of patient's allergies indicates no known allergies.  Home Medications   Prior to Admission medications   Medication Sig Start Date End Date Taking? Authorizing Provider  amoxicillin (AMOXIL) 500 MG capsule Take 1 capsule (500 mg total) by mouth 3 (three) times daily.  08/07/14   Hope Orlene Och, NP  naproxen (NAPROSYN) 500 MG tablet Take 1 tablet (500 mg total) by mouth 2 (two) times daily. 08/07/14   Hope Orlene Och, NP  pantoprazole (PROTONIX) 40 MG tablet Take 1 tablet (40 mg total) by mouth 2 (two) times daily before a meal. Patient not taking: Reported on 08/07/2014 06/16/13   Malissa Hippo, MD   BP 145/92 mmHg  Pulse 104  Temp(Src) 98.4 F (36.9 C) (Oral)  Resp 16  Ht  (1.854 m)  Wt 195 lb (88.451 kg)  BMI 25.73 kg/m2  SpO2 99% Physical Exam  Constitutional: He appears well-developed.  HENT:  Head: Normocephalic.  Left Ear: External ear normal.  Swelling bilat tonsils,  Erythema right tm,  Eyes: Conjunctivae are normal. Pupils are equal, round, and reactive to light.  Neck: Normal range of motion.  Cardiovascular: Normal rate and normal heart sounds.   Pulmonary/Chest: Effort normal and breath sounds normal.  Abdominal: Soft.  Lymphadenopathy:    He has cervical adenopathy.  Nursing note reviewed.   ED Course  Procedures (including critical care time) Labs Review Labs Reviewed  RAPID STREP SCREEN (NOT AT [facility])  CULTURE, GROUP A STREP    Imaging Review No results found. I have personally reviewed and evaluated these images and lab results as part of my medical decision-making.   EKG Interpretation None      MDM   Final diagnoses:  Pharyngitis    amoxicillian avs    Elson Areas, PA-C 01/27/15 1129  Zadie Rhine, MD 01/27/15 1136

## 2015-01-27 NOTE — Discharge Instructions (Signed)
Otitis Media  Otitis media is redness, soreness, and inflammation of the middle ear. Otitis media may be caused by allergies or, most commonly, by infection. Often it occurs as a complication of the common cold.  SIGNS AND SYMPTOMS  Symptoms of otitis media may include:   Earache.   Fever.   Ringing in your ear.   Headache.   Leakage of fluid from the ear.  DIAGNOSIS  To diagnose otitis media, your health care provider will examine your ear with an otoscope. This is an instrument that allows your health care provider to see into your ear in order to examine your eardrum. Your health care provider also will ask you questions about your symptoms.  TREATMENT   Typically, otitis media resolves on its own within 3-5 days. Your health care provider may prescribe medicine to ease your symptoms of pain. If otitis media does not resolve within 5 days or is recurrent, your health care provider may prescribe antibiotic medicines if he or she suspects that a bacterial infection is the cause.  HOME CARE INSTRUCTIONS    If you were prescribed an antibiotic medicine, finish it all even if you start to feel better.   Take medicines only as directed by your health care provider.   Keep all follow-up visits as directed by your health care provider.  SEEK MEDICAL CARE IF:   You have otitis media only in one ear, or bleeding from your nose, or both.   You notice a lump on your neck.   You are not getting better in 3-5 days.   You feel worse instead of better.  SEEK IMMEDIATE MEDICAL CARE IF:    You have pain that is not controlled with medicine.   You have swelling, redness, or pain around your ear or stiffness in your neck.   You notice that part of your face is paralyzed.   You notice that the bone behind your ear (mastoid) is tender when you touch it.  MAKE SURE YOU:    Understand these instructions.   Will watch your condition.   Will get help right away if you are not doing well or get worse.  Document Released:  01/25/2004 Document Revised: 09/05/2013 Document Reviewed: 11/16/2012  Exit[employer] Patient Information 2015 [employer]. This information is not intended to replace advice given to you by your health care provider. Make sure you discuss any questions you have with your health care provider.  Pharyngitis  Pharyngitis is redness, pain, and swelling (inflammation) of your pharynx.   CAUSES   Pharyngitis is usually caused by infection. Most of the time, these infections are from viruses (viral) and are part of a cold. However, sometimes pharyngitis is caused by bacteria (bacterial). Pharyngitis can also be caused by allergies. Viral pharyngitis may be spread from person to person by coughing, sneezing, and personal items or utensils (cups, forks, spoons, toothbrushes). Bacterial pharyngitis may be spread from person to person by more intimate contact, such as kissing.   SIGNS AND SYMPTOMS   Symptoms of pharyngitis include:    Sore throat.    Tiredness (fatigue).    Low-grade fever.    Headache.   Joint pain and muscle aches.   Skin rashes.   Swollen lymph nodes.   Plaque-like film on throat or tonsils (often seen with bacterial pharyngitis).  DIAGNOSIS   Your health care provider will ask you questions about your illness and your symptoms. Your medical history, along with a physical exam, is often all that   is needed to diagnose pharyngitis. Sometimes, a rapid strep test is done. Other lab tests may also be done, depending on the suspected cause.   TREATMENT   Viral pharyngitis will usually get better in 3-4 days without the use of medicine. Bacterial pharyngitis is treated with medicines that kill germs (antibiotics).   HOME CARE INSTRUCTIONS    Drink enough water and fluids to keep your urine clear or pale yellow.    Only take over-the-counter or prescription medicines as directed by your health care provider:    If you are prescribed antibiotics, make sure you finish them even if you start to feel  better.    Do not take aspirin.    Get lots of rest.    Gargle with 8 oz of salt water ( tsp of salt per 1 qt of water) as often as every 1-2 hours to soothe your throat.    Throat lozenges (if you are not at risk for choking) or sprays may be used to soothe your throat.  SEEK MEDICAL CARE IF:    You have large, tender lumps in your neck.   You have a rash.   You cough up green, yellow-brown, or bloody spit.  SEEK IMMEDIATE MEDICAL CARE IF:    Your neck becomes stiff.   You drool or are unable to swallow liquids.   You vomit or are unable to keep medicines or liquids down.   You have severe pain that does not go away with the use of recommended medicines.   You have trouble breathing (not caused by a stuffy nose).  MAKE SURE YOU:    Understand these instructions.   Will watch your condition.   Will get help right away if you are not doing well or get worse.  Document Released: 04/21/2005 Document Revised: 02/09/2013 Document Reviewed: 12/27/2012  [employer] Patient Information 2015 [employer]. This information is not intended to replace advice given to you by your health care provider. Make sure you discuss any questions you have with your health care provider.

## 2015-01-29 LAB — CULTURE, GROUP A STREP

## 2015-04-30 ENCOUNTER — Emergency Department (HOSPITAL_COMMUNITY)
Admission: EM | Admit: 2015-04-30 | Discharge: 2015-04-30 | Disposition: A | Discharge status: Home / Self Care | Payer: Managed Care, Other (non HMO) | Attending: Emergency Medicine | Admitting: Emergency Medicine

## 2015-04-30 ENCOUNTER — Encounter (HOSPITAL_COMMUNITY): Payer: Self-pay

## 2015-04-30 DIAGNOSIS — K029 Dental caries, unspecified: Secondary | ICD-10-CM | POA: Insufficient documentation

## 2015-04-30 DIAGNOSIS — J45909 Unspecified asthma, uncomplicated: Secondary | ICD-10-CM | POA: Diagnosis not present

## 2015-04-30 DIAGNOSIS — Z8719 Personal history of other diseases of the digestive system: Secondary | ICD-10-CM | POA: Insufficient documentation

## 2015-04-30 DIAGNOSIS — F17210 Nicotine dependence, cigarettes, uncomplicated: Secondary | ICD-10-CM | POA: Diagnosis not present

## 2015-04-30 DIAGNOSIS — K0889 Other specified disorders of teeth and supporting structures: Secondary | ICD-10-CM

## 2015-04-30 MED ORDER — TRAMADOL HCL 50 MG PO TABS: 50.0000 mg | ORAL_TABLET | Freq: Four times a day (QID) | ORAL | Status: DC | PRN

## 2015-04-30 MED ORDER — CLINDAMYCIN HCL 150 MG PO CAPS: 300.0000 mg | ORAL_CAPSULE | Freq: Four times a day (QID) | ORAL | Status: DC

## 2015-04-30 NOTE — ED Provider Notes (Signed)
CSN: [id number]     Arrival date & time 04/30/15  1337 History  By signing my name below, I, [facility]arr[facility] Ali, attest that this documentation has been prepared under the direction and in the presence of Bobby Overfield, PA-C. Electronically Signed: Randell PatientMarrissa Ali, ED Scribe. 04/30/2015. 2:42 PM.   Chief Complaint  Ali presents with  . Dental Pain   The history is provided by the Ali. No language interpreter was used.   HPI Comments: Bobby Ali is a 29 y.o. male who presents to the Emergency Department complaining of mild, constant, sore, left lower dental pain onset 3 days ago. He reports that a tooth on the bottom left side broke off 3 days ago. Ali endorses radiation of pain to left-side cheek and jaw. Pain is not worsened by anything and improved by heat. He has tried Tylenol with temporary relief. Per Ali, he attempted to make an appointment with his dentist but was unable to get one for today. Ali denies fever and chills, neck pain or difficulty swallowing  Past Medical History  Diagnosis Date  . Asthma   . Hx of migraine headaches 1998    15 years per Ali   . GERD (gastroesophageal reflux disease)    Past Surgical History  Procedure Laterality Date  . Pilonidal cyst excision    . Finger fracture surgery    . Esophagogastroduodenoscopy N/A 06/16/2013    Procedure: ESOPHAGOGASTRODUODENOSCOPY (EGD);  Surgeon: Bobby HippoNajeeb U Rehman, MD;  Location: [facility];  Service: Endoscopy;  Laterality: N/A;  250-moved to 1155 Ann to notify pt   Family History  Problem Relation Age of Onset  . Colon cancer Neg Hx    Social History  Substance Use Topics  . Smoking status: Current Every Day Smoker -- 1.00 packs/day for 7 years    Types: Cigarettes  . Smokeless tobacco: None     Comment: x 7 rs.   . Alcohol Use: Yes     Comment: occasional    Review of Systems  Constitutional: Negative for fever and chills.  HENT: Positive for dental problem (Left lower  dental pain).   All other systems reviewed and are negative.     Allergies  Review of Ali's allergies indicates no known allergies.  Home Medications   Prior to Admission medications   Medication Sig Start Date End Date Taking? Authorizing Provider  acetaminophen (TYLENOL) 500 MG tablet Take 500 mg by mouth every 6 (six) hours as needed for mild pain or moderate pain.   Yes Historical Provider, MD  amoxicillin (AMOXIL) 500 MG capsule Take 1 capsule (500 mg total) by mouth 3 (three) times daily. Ali not taking: Reported on 04/30/2015 01/27/15   Elson AreasLeslie K Sofia, PA-C   BP 136/72 mmHg  Temp(Src) 98.7 F (37.1 C) (Oral)  Resp 16  Ht 6\' 1"  (1.854 m)  Wt 195 lb (88.451 kg)  BMI 25.73 kg/m2  SpO2 100% Physical Exam  Constitutional: He is oriented to person, place, and time. He appears well-developed and well-nourished. No distress.  HENT:  Head: Normocephalic and atraumatic.  Mouth/Throat: Uvula is midline, oropharynx is clear and moist and mucous membranes are normal. No trismus in the jaw. Dental caries present. No uvula swelling.  Dental caries with tenderness of the gingava of the left lower first molar. No obvious abscess.  Eyes: Conjunctivae and EOM are normal.  Neck: Normal range of motion. Neck supple. No tracheal deviation present.  No adenopathy.  Cardiovascular: Normal rate and regular rhythm.  No murmur heard. Pulmonary/Chest: Effort normal and breath sounds normal. No respiratory distress. He has no wheezes.  Abdominal: There is no CVA tenderness.  Musculoskeletal: Normal range of motion.  Neurological: He is alert and oriented to person, place, and time.  Skin: Skin is warm and dry.  Psychiatric: He has a normal mood and affect. His behavior is normal.  Nursing note and vitals reviewed.   ED Course  Procedures   DIAGNOSTIC STUDIES: Oxygen Saturation is 100% on RA, normal by my interpretation.    COORDINATION OF CARE: 2:34 PM Will prescribe pain  medication and antibiotics. Discussed treatment plan with pt at bedside and pt agreed to plan.   MDM   Final diagnoses:  Pain, dental   Pt is well appearing, vitals stable.  Airway patent.  No concerning sx's for Ludwig's angina.  Rx for clinda and ultram   I personally performed the services described in this documentation, which was scribed in my presence. The recorded information has been reviewed and is accurate.    Pauline Aus, PA-C 05/03/15 1339  Raeford Razor, MD 05/03/15 1350

## 2015-04-30 NOTE — ED Notes (Signed)
Instructed pt to take all of antibiotics as prescribed. 

## 2015-04-30 NOTE — ED Notes (Signed)
Left lower dental pain x3 days. Denies fever.

## 2015-05-23 ENCOUNTER — Emergency Department (HOSPITAL_COMMUNITY)
Admission: EM | Admit: 2015-05-23 | Discharge: 2015-05-23 | Disposition: A | Discharge status: Home / Self Care | Payer: Managed Care, Other (non HMO) | Attending: Emergency Medicine | Admitting: Emergency Medicine

## 2015-05-23 ENCOUNTER — Emergency Department (HOSPITAL_COMMUNITY): Payer: Managed Care, Other (non HMO)

## 2015-05-23 ENCOUNTER — Encounter (HOSPITAL_COMMUNITY): Payer: Self-pay | Admitting: Emergency Medicine

## 2015-05-23 DIAGNOSIS — Z8719 Personal history of other diseases of the digestive system: Secondary | ICD-10-CM | POA: Insufficient documentation

## 2015-05-23 DIAGNOSIS — Z8679 Personal history of other diseases of the circulatory system: Secondary | ICD-10-CM | POA: Insufficient documentation

## 2015-05-23 DIAGNOSIS — J45909 Unspecified asthma, uncomplicated: Secondary | ICD-10-CM | POA: Insufficient documentation

## 2015-05-23 DIAGNOSIS — M545 Low back pain, unspecified: Secondary | ICD-10-CM

## 2015-05-23 DIAGNOSIS — F17210 Nicotine dependence, cigarettes, uncomplicated: Secondary | ICD-10-CM | POA: Diagnosis not present

## 2015-05-23 DIAGNOSIS — R109 Unspecified abdominal pain: Secondary | ICD-10-CM | POA: Insufficient documentation

## 2015-05-23 LAB — URINALYSIS, ROUTINE W REFLEX MICROSCOPIC
Bilirubin Urine: NEGATIVE
GLUCOSE, UA: NEGATIVE mg/dL
Hgb urine dipstick: NEGATIVE
Ketones, ur: NEGATIVE mg/dL
Leukocytes, UA: NEGATIVE
NITRITE: NEGATIVE
PH: 6.5 (ref 5.0–8.0)
PROTEIN: NEGATIVE mg/dL
Specific Gravity, Urine: 1.025 (ref 1.005–1.030)

## 2015-05-23 MED ORDER — NAPROXEN 500 MG PO TABS: 500.0000 mg | ORAL_TABLET | Freq: Two times a day (BID) | ORAL | Status: DC

## 2015-05-23 MED ORDER — KETOROLAC TROMETHAMINE 60 MG/2ML IM SOLN
60.0000 mg | Freq: Once | INTRAMUSCULAR | Status: AC
  Administered 2015-05-23: 60 mg via INTRAMUSCULAR
  Filled 2015-05-23: qty 2

## 2015-05-23 MED ORDER — HYDROCODONE-ACETAMINOPHEN 5-325 MG PO TABS: 1.0000 | ORAL_TABLET | ORAL | Status: DC | PRN

## 2015-05-23 MED ORDER — CYCLOBENZAPRINE HCL 5 MG PO TABS: 5.0000 mg | ORAL_TABLET | Freq: Three times a day (TID) | ORAL | Status: DC | PRN

## 2015-05-23 NOTE — Discharge Instructions (Signed)
Back Pain, Adult °Back pain is very common in adults. The cause of back pain is rarely dangerous and the pain often gets better over time. The cause of your back pain may not be known. Some common causes of back pain include: °· Strain of the muscles or ligaments supporting the spine. °· Wear and tear (degeneration) of the spinal disks. °· Arthritis. °· Direct injury to the back. °For many people, back pain may return. Since back pain is rarely dangerous, most people can learn to manage this condition on their own. °HOME CARE INSTRUCTIONS °Watch your back pain for any changes. The following actions may help to lessen any discomfort you are feeling: °· Remain active. It is stressful on your back to sit or stand in one place for long periods of time. Do not sit, drive, or stand in one place for more than 30 minutes at a time. Take short walks on even surfaces as soon as you are able. Try to increase the length of time you walk each day. °· Exercise regularly as directed by your health care provider. Exercise helps your back heal faster. It also helps avoid future injury by keeping your muscles strong and flexible. °· Do not stay in bed. Resting more than 1-2 days can delay your recovery. °· Pay attention to your body when you bend and lift. The most comfortable positions are those that put less stress on your recovering back. Always use proper lifting techniques, including: °¨ Bending your knees. °¨ Keeping the load close to your body. °¨ Avoiding twisting. °· Find a comfortable position to sleep. Use a firm mattress and lie on your side with your knees slightly bent. If you lie on your back, put a pillow under your knees. °· Avoid feeling anxious or stressed. Stress increases muscle tension and can worsen back pain. It is important to recognize when you are anxious or stressed and learn ways to manage it, such as with exercise. °· Take medicines only as directed by your health care provider. Over-the-counter  medicines to reduce pain and inflammation are often the most helpful. Your health care provider may prescribe muscle relaxant drugs. These medicines help dull your pain so you can more quickly return to your normal activities and healthy exercise. °· Apply ice to the injured area: °¨ Put ice in a plastic bag. °¨ Place a towel between your skin and the bag. °¨ Leave the ice on for 20 minutes, 2-3 times a day for the first 2-3 days. After that, ice and heat may be alternated to reduce pain and spasms. °· Maintain a healthy weight. Excess weight puts extra stress on your back and makes it difficult to maintain good posture. °SEEK MEDICAL CARE IF: °· You have pain that is not relieved with rest or medicine. °· You have increasing pain going down into the legs or buttocks. °· You have pain that does not improve in one week. °· You have night pain. °· You lose weight. °· You have a fever or chills. °SEEK IMMEDIATE MEDICAL CARE IF:  °· You develop new bowel or bladder control problems. °· You have unusual weakness or numbness in your arms or legs. °· You develop nausea or vomiting. °· You develop abdominal pain. °· You feel faint. °  °This information is not intended to replace advice given to you by your health care provider. Make sure you discuss any questions you have with your health care provider. °  °Document Released: 04/21/2005 Document Revised: 05/12/2014 Document Reviewed: 08/23/2013 °Elsevier Interactive Patient Education ©2016 [employer]      Do not drive within 4 hours of taking hydrocodone as this will make you drowsy.  Avoid lifting,  Bending,  Twisting or any other activity that worsens your pain over the next week.  Apply an  icepack  to your lower back for 10-15 minutes every 2 hours for the next 2 days. You may also add a heating pad for 20 minutes several times daily starting on Saturday.   You should get rechecked if your symptoms are not better over the next 5 days,  Or you develop increased  pain,  Weakness in your leg(s) or loss of bladder or bowel function - these are symptoms of a worse injury.

## 2015-05-23 NOTE — ED Notes (Signed)
Pt reports waking up with left lower back pain today radiating down left leg, no urinary symptoms, no injury noted.  Pt alert and oriented.

## 2015-05-23 NOTE — ED Provider Notes (Signed)
CSN: [id number]     Arrival date & time 05/23/15  1334 History   First MD Initiated Contact with Patient 05/23/15 1345     Chief Complaint  Patient presents with  . Back Pain     (Consider location/radiation/quality/duration/timing/severity/associated sxs/prior Treatment) The history is provided by the patient.   Bobby Ali is a 30 y.o. male presenting with sudden onset of left lower back and flank pain which radiates around to his left side and started suddenly while standing at work prior to arrival.  His pain is constant and sharp and worsened with movement and palpation.  He denies urinary frequency or hematuria but has not urinated since his symptoms began.  He reports a history of kidney stone about 7 years ago with similar symptoms today.  He denies nausea, vomiting, fever.  He also denies radiation of pain into his legs, denies weakness or numbness and has had no urinary retention or incontinence.  He has had no medicines prior to arriving here.    Past Medical History  Diagnosis Date  . Asthma   . Hx of migraine headaches 1998    15 years per patient   . GERD (gastroesophageal reflux disease)    Past Surgical History  Procedure Laterality Date  . Pilonidal cyst excision    . Finger fracture surgery    . Esophagogastroduodenoscopy N/A 06/16/2013    Procedure: ESOPHAGOGASTRODUODENOSCOPY (EGD);  Surgeon: Malissa Hippo, MD;  Location: [facility];  Service: Endoscopy;  Laterality: N/A;  250-moved to 1155 Ann to notify pt   Family History  Problem Relation Age of Onset  . Colon cancer Neg Hx    Social History  Substance Use Topics  . Smoking status: Current Every Day Smoker -- 1.00 packs/day for 7 years    Types: Cigarettes  . Smokeless tobacco: None     Comment: x 7 rs.   . Alcohol Use: Yes     Comment: occasional    Review of Systems  Constitutional: Negative for fever.  Respiratory: Negative for shortness of breath.   Cardiovascular: Negative for chest  pain and leg swelling.  Gastrointestinal: Negative for nausea, vomiting, abdominal pain, constipation and abdominal distention.  Genitourinary: Positive for flank pain. Negative for dysuria, urgency, frequency and difficulty urinating.  Musculoskeletal: Positive for back pain. Negative for joint swelling and gait problem.  Skin: Negative for rash.  Neurological: Negative for weakness and numbness.      Allergies  Review of patient's allergies indicates no known allergies.  Home Medications   Prior to Admission medications   Medication Sig Start Date End Date Taking? Authorizing Provider  acetaminophen (TYLENOL) 500 MG tablet Take 500 mg by mouth every 6 (six) hours as needed for mild pain or moderate pain.   Yes Historical Provider, MD  clindamycin (CLEOCIN) 150 MG capsule Take 2 capsules (300 mg total) by mouth 4 (four) times daily. For 7 days Patient not taking: Reported on 05/23/2015 04/30/15   Tammy Triplett, PA-C  cyclobenzaprine (FLEXERIL) 5 MG tablet Take 1 tablet (5 mg total) by mouth 3 (three) times daily as needed for muscle spasms. 05/23/15   Burgess Amor, PA-C  HYDROcodone-acetaminophen (NORCO/VICODIN) 5-325 MG tablet Take 1 tablet by mouth every 4 (four) hours as needed. 05/23/15   Burgess Amor, PA-C  naproxen (NAPROSYN) 500 MG tablet Take 1 tablet (500 mg total) by mouth 2 (two) times daily. 05/23/15   Burgess Amor, PA-C  traMADol (ULTRAM) 50 MG tablet Take 1 tablet (50 mg  total) by mouth every 6 (six) hours as needed. Patient not taking: Reported on 05/23/2015 04/30/15   Tammy Triplett, PA-C   BP 138/87 mmHg  Pulse 90  Temp(Src) 98.6 F (37 C)  Resp 16  Ht  (1.854 m)  Wt 89.812 kg  BMI 26.13 kg/m2  SpO2 99% Physical Exam  Constitutional: He appears well-developed and well-nourished.  HENT:  Head: Normocephalic.  Eyes: Conjunctivae are normal.  Neck: Normal range of motion. Neck supple.  Cardiovascular: Normal rate and intact distal pulses.   Pedal pulses normal.   Pulmonary/Chest: Effort normal.  Abdominal: Soft. Bowel sounds are normal. He exhibits no distension and no mass.  Musculoskeletal: Normal range of motion. He exhibits no edema.       Lumbar back: He exhibits tenderness. He exhibits no swelling, no edema and no spasm.  ttp left lower back. Left cva tenderness.  Neurological: He is alert. He has normal strength. He displays no atrophy and no tremor. No sensory deficit. Gait normal.  Reflex Scores:      Patellar reflexes are 2+ on the right side and 2+ on the left side.      Achilles reflexes are 2+ on the right side and 2+ on the left side. No strength deficit noted in hip and knee flexor and extensor muscle groups.  Ankle flexion and extension intact.  Skin: Skin is warm and dry.  Psychiatric: He has a normal mood and affect.  Nursing note and vitals reviewed.   ED Course  Procedures (including critical care time) Labs Review Labs Reviewed  URINALYSIS, ROUTINE W REFLEX MICROSCOPIC (NOT AT [facility])    Imaging Review Ct Renal Stone Study  05/23/2015  CLINICAL DATA:  Left flank pain for 1 day EXAM: CT ABDOMEN AND PELVIS WITHOUT CONTRAST TECHNIQUE: Multidetector CT imaging of the abdomen and pelvis was performed following the standard protocol without oral or intravenous contrast material administration. COMPARISON:  May 11, 2008 FINDINGS: Lower chest: There is a stable 3 mm nodular opacity in the posterior segment right lower lobe. There is a stable 4 mm nodular opacity abutting the pleura in the lateral segment left lower lobe. Lung bases otherwise are clear. Hepatobiliary: Liver is prominent, measuring 22.3 cm in length. No focal liver lesions are identified on this noncontrast enhanced study. The gallbladder wall is not appreciably thickened. There is no biliary duct dilatation. Pancreas: No pancreatic mass or inflammatory focus. Spleen: No splenic lesions are identified. Adrenals/Urinary Tract: Adrenals appear normal bilaterally. There is  no renal mass or hydronephrosis on either side. There is no renal or ureteral calculus on either side. Urinary bladder is midline with wall thickness within normal limits. Stomach/Bowel: There are scattered sigmoid diverticula without diverticulitis. There is no bowel wall or mesenteric thickening. No bowel obstruction. No free air or portal venous air. Vascular/Lymphatic: There is no abdominal aortic aneurysm. No vascular lesions are identified on this noncontrast enhanced study. There is no demonstrable adenopathy in the abdomen or pelvis. Reproductive: Prostate and seminal vesicles appear normal in size and contour. There is no pelvic mass or pelvic fluid collection. Other: Appendix appears normal. There is no abscess or ascites in the abdomen or pelvis. Musculoskeletal: There is disc narrowing at L4-5 and L5-S1. No blastic or lytic bone lesions are identified. No intramuscular or abdominal wall lesion apparent. IMPRESSION: A cause for patient's symptoms has not been established with this study. No renal or ureteral calculus. No hydronephrosis. No bowel obstruction.  No abscess.  Appendix appears normal. There  are scattered sigmoid diverticula without diverticulitis. Liver prominent without focal lesion. Small nodular opacities in the lung bases are stable since 2010 study. Stability over this time interval is consistent with benign etiology. Electronically Signed   By: Bretta Bang III M.D.   On: 05/23/2015 14:40   I have personally reviewed and evaluated these images and lab results as part of my medical decision-making.   EKG Interpretation None      MDM   Final diagnoses:  Flank pain  Left-sided low back pain without sciatica    Pt with left lower back pain with negative renal CT study for renal or ureteral stones or other findings as source of pain.  Suspect musculoskeletal source. Pt was prescribed hydrocodone, flexeril and naproxen for pain and inflammation. Advised ice tx x 2 days,  adding heat on day 3. Plan f/u with pcp in 1 week if sx persist or worsen.  No neuro deficit on exam or by history to suggest emergent or surgical presentation.  Discussed worsened sx that should prompt immediate re-evaluation including distal weakness, bowel/bladder retention/incontinence.           Burgess Amor, PA-C 05/23/15 2125  Marily Memos, MD 05/24/15 [phone]

## 2015-11-29 ENCOUNTER — Encounter (HOSPITAL_COMMUNITY): Payer: Self-pay | Admitting: Emergency Medicine

## 2015-11-29 ENCOUNTER — Emergency Department (HOSPITAL_COMMUNITY)
Admission: EM | Admit: 2015-11-29 | Discharge: 2015-11-29 | Disposition: A | Discharge status: Home / Self Care | Payer: Managed Care, Other (non HMO) | Attending: Emergency Medicine | Admitting: Emergency Medicine

## 2015-11-29 DIAGNOSIS — K029 Dental caries, unspecified: Secondary | ICD-10-CM

## 2015-11-29 DIAGNOSIS — J45909 Unspecified asthma, uncomplicated: Secondary | ICD-10-CM | POA: Diagnosis not present

## 2015-11-29 DIAGNOSIS — F17210 Nicotine dependence, cigarettes, uncomplicated: Secondary | ICD-10-CM | POA: Insufficient documentation

## 2015-11-29 DIAGNOSIS — K0889 Other specified disorders of teeth and supporting structures: Secondary | ICD-10-CM | POA: Diagnosis present

## 2015-11-29 MED ORDER — OXYCODONE-ACETAMINOPHEN 5-325 MG PO TABS: 1.0000 | ORAL_TABLET | Freq: Four times a day (QID) | ORAL | 0 refills | Status: DC | PRN

## 2015-11-29 MED ORDER — NAPROXEN 500 MG PO TABS: 500.0000 mg | ORAL_TABLET | Freq: Two times a day (BID) | ORAL | 0 refills | Status: DC

## 2015-11-29 MED ORDER — AMOXICILLIN 500 MG PO CAPS: 500.0000 mg | ORAL_CAPSULE | Freq: Three times a day (TID) | ORAL | 0 refills | Status: DC

## 2015-11-29 NOTE — ED Provider Notes (Signed)
[facility] Provider Note   CSN: [id number] Arrival date & time: 11/29/15  1221  First Provider Contact:  First MD Initiated Contact with Patient 11/29/15 1310        History   Chief Complaint Chief Complaint  Patient presents with  . Dental Pain    HPI Bobby Ali is a 30 y.o. male who presents with cc of dental pain. Hx of pain in the same tooth. The patient started having aching pain in the tooth 2 days ago that is now severe, worse with palpation , cold air, heat. Has a history of broken tooth and decay.Daily smoker. Denies difficulty swallowing, fevers.  HPI  Past Medical History:  Diagnosis Date  . Asthma   . GERD (gastroesophageal reflux disease)   . Hx of migraine headaches 1998   15 years per patient     Patient Active Problem List   Diagnosis Date Noted  . GERD (gastroesophageal reflux disease) 06/06/2013  . Pain in joint, shoulder region 08/02/2012  . Muscle weakness (generalized) 08/02/2012    Past Surgical History:  Procedure Laterality Date  . ESOPHAGOGASTRODUODENOSCOPY N/A 06/16/2013   Procedure: ESOPHAGOGASTRODUODENOSCOPY (EGD);  Surgeon: Malissa Hippo, MD;  Location: [facility];  Service: Endoscopy;  Laterality: N/A;  250-moved to 1155 Ann to notify pt  . FINGER FRACTURE SURGERY    . PILONIDAL CYST EXCISION         Home Medications    Prior to Admission medications   Medication Sig Start Date End Date Taking? Authorizing Provider  amoxicillin (AMOXIL) 500 MG capsule Take 1 capsule (500 mg total) by mouth 3 (three) times daily. 11/29/15   Arthor Captain, PA-C  clindamycin (CLEOCIN) 150 MG capsule Take 2 capsules (300 mg total) by mouth 4 (four) times daily. For 7 days Patient not taking: Reported on 05/23/2015 04/30/15   Tammy Triplett, PA-C  naproxen (NAPROSYN) 500 MG tablet Take 1 tablet (500 mg total) by mouth 2 (two) times daily. 11/29/15   Arthor Captain, PA-C  oxyCODONE-acetaminophen (PERCOCET) 5-325 MG tablet Take 1-2 tablets  by mouth every 6 (six) hours as needed for severe pain. 11/29/15   Arthor Captain, PA-C    Family History Family History  Problem Relation Age of Onset  . Colon cancer Neg Hx     Social History Social History  Substance Use Topics  . Smoking status: Current Every Day Smoker    Packs/day: 1.00    Years: 7.00    Types: Cigarettes  . Smokeless tobacco: Not on file     Comment: x 7 rs.   . Alcohol use Yes     Comment: occasional     Allergies   Review of patient's allergies indicates no known allergies.   Review of Systems Review of Systems  Constitutional: Negative for fever.  HENT: Positive for dental problem. Negative for sore throat, trouble swallowing and voice change.      Physical Exam Updated Vital Signs BP 151/97 (BP Location: Left Arm)   Pulse 86   Temp 98.6 F (37 C) (Oral)   Resp 16   Ht 6\' 1"  (1.854 m)   Wt 93 kg   SpO2 100%   BMI 27.05 kg/m   Physical Exam  Constitutional: He appears well-developed and well-nourished. No distress.  HENT:  Head: Normocephalic and atraumatic.  Mouth/Throat:    Eyes: Conjunctivae are normal. No scleral icterus.  Neck: Normal range of motion. Neck supple.  Cardiovascular: Normal rate, regular rhythm and normal heart sounds.  Pulmonary/Chest: Effort normal and breath sounds normal. No respiratory distress.  Abdominal: Soft. There is no tenderness.  Musculoskeletal: He exhibits no edema.  Neurological: He is alert.  Skin: Skin is warm and dry. He is not diaphoretic.  Psychiatric: His behavior is normal.  Nursing note and vitals reviewed.    ED Treatments / Results  Labs (all labs ordered are listed, but only abnormal results are displayed) Labs Reviewed - No data to display  EKG  EKG Interpretation None       Radiology No results found.  Procedures Procedures (including critical care time)  Medications Ordered in ED Medications - No data to display   Initial Impression / Assessment and Plan  / ED Course  I have reviewed the triage vital signs and the nursing notes.  Pertinent labs & imaging results that were available during my care of the patient were reviewed by me and considered in my medical decision making (see chart for details).  Clinical Course    [place] substances [place] Patient with toothache.  No gross abscess.  Exam unconcerning for Ludwig's angina or spread of infection.  Will treat with penicillin and pain medicine.  Urged patient to follow-up with dentist.     Final Clinical Impressions(s) / ED Diagnoses   Final diagnoses:  Pain due to dental caries    New Prescriptions New Prescriptions   AMOXICILLIN (AMOXIL) 500 MG CAPSULE    Take 1 capsule (500 mg total) by mouth 3 (three) times daily.   OXYCODONE-ACETAMINOPHEN (PERCOCET) 5-325 MG TABLET    Take 1-2 tablets by mouth every 6 (six) hours as needed for severe pain.     Arthor Captain, PA-C 11/29/15 1323    Shaune Pollack, MD 11/29/15 [phone]

## 2015-11-29 NOTE — ED Triage Notes (Signed)
Pt reports abscessed left lower tooth x2 days, denies drainage/swelling/fever.  Pt reports increased pain over last 2 days.

## 2017-05-10 ENCOUNTER — Other Ambulatory Visit: Payer: Self-pay

## 2017-05-10 ENCOUNTER — Encounter (HOSPITAL_COMMUNITY): Payer: Self-pay | Admitting: Emergency Medicine

## 2017-05-10 ENCOUNTER — Emergency Department (HOSPITAL_COMMUNITY)
Admission: EM | Admit: 2017-05-10 | Discharge: 2017-05-10 | Disposition: A | Discharge status: Home / Self Care | Payer: PRIVATE HEALTH INSURANCE | Attending: Emergency Medicine | Admitting: Emergency Medicine

## 2017-05-10 DIAGNOSIS — K625 Hemorrhage of anus and rectum: Secondary | ICD-10-CM | POA: Insufficient documentation

## 2017-05-10 DIAGNOSIS — Z5321 Procedure and treatment not carried out due to patient leaving prior to being seen by health care provider: Secondary | ICD-10-CM | POA: Diagnosis not present

## 2017-05-10 LAB — COMPREHENSIVE METABOLIC PANEL
ALBUMIN: 3.7 g/dL (ref 3.5–5.0)
ALK PHOS: 50 U/L (ref 38–126)
ALT: 17 U/L (ref 17–63)
ANION GAP: 11 (ref 5–15)
AST: 17 U/L (ref 15–41)
BUN: 9 mg/dL (ref 6–20)
CHLORIDE: 108 mmol/L (ref 101–111)
CO2: 22 mmol/L (ref 22–32)
Calcium: 8.4 mg/dL — ABNORMAL LOW (ref 8.9–10.3)
Creatinine, Ser: 0.9 mg/dL (ref 0.61–1.24)
GFR calc Af Amer: >60 mL/min (ref >60)
GFR calc non Af Amer: >60 mL/min (ref >60)
GLUCOSE: 112 mg/dL — AB (ref 65–99)
POTASSIUM: 4 mmol/L (ref 3.5–5.1)
SODIUM: 141 mmol/L (ref 135–145)
Total Bilirubin: 0.3 mg/dL (ref 0.3–1.2)
Total Protein: 6.4 g/dL — ABNORMAL LOW (ref 6.5–8.1)

## 2017-05-10 LAB — CBC
HEMATOCRIT: 48.8 % (ref 39.0–52.0)
HEMOGLOBIN: 16.2 g/dL (ref 13.0–17.0)
MCH: 29 pg (ref 26.0–34.0)
MCHC: 33.2 g/dL (ref 30.0–36.0)
MCV: 87.3 fL (ref 78.0–100.0)
Platelets: 223 10*3/uL (ref 150–400)
RBC: 5.59 MIL/uL (ref 4.22–5.81)
RDW: 14.2 % (ref 11.5–15.5)
WBC: 10.6 10*3/uL — ABNORMAL HIGH (ref 4.0–10.5)

## 2017-05-10 LAB — TYPE AND SCREEN
ABO/RH(D): A POS
Antibody Screen: NEGATIVE

## 2017-05-10 NOTE — ED Triage Notes (Signed)
Patient c/o large amount of rectal bleeding that started on Friday. Per patient started as bright red blood but is now dark blood. Patient states lower abd pain and rectal pain. Denies any nausea or vomiting. No hx of GI issues.

## 2017-05-14 ENCOUNTER — Other Ambulatory Visit: Payer: Self-pay

## 2017-05-14 ENCOUNTER — Emergency Department (HOSPITAL_COMMUNITY)
Admission: EM | Admit: 2017-05-14 | Discharge: 2017-05-14 | Disposition: A | Discharge status: Home / Self Care | Payer: PRIVATE HEALTH INSURANCE | Attending: Emergency Medicine | Admitting: Emergency Medicine

## 2017-05-14 ENCOUNTER — Encounter (HOSPITAL_COMMUNITY): Payer: Self-pay | Admitting: Emergency Medicine

## 2017-05-14 DIAGNOSIS — K625 Hemorrhage of anus and rectum: Secondary | ICD-10-CM | POA: Diagnosis present

## 2017-05-14 DIAGNOSIS — J45909 Unspecified asthma, uncomplicated: Secondary | ICD-10-CM | POA: Insufficient documentation

## 2017-05-14 DIAGNOSIS — F17210 Nicotine dependence, cigarettes, uncomplicated: Secondary | ICD-10-CM | POA: Diagnosis not present

## 2017-05-14 DIAGNOSIS — Z79899 Other long term (current) drug therapy: Secondary | ICD-10-CM | POA: Insufficient documentation

## 2017-05-14 DIAGNOSIS — K602 Anal fissure, unspecified: Secondary | ICD-10-CM | POA: Insufficient documentation

## 2017-05-14 DIAGNOSIS — K6289 Other specified diseases of anus and rectum: Secondary | ICD-10-CM | POA: Diagnosis not present

## 2017-05-14 MED ORDER — DOCUSATE SODIUM 100 MG PO CAPS: 100.0000 mg | ORAL_CAPSULE | Freq: Two times a day (BID) | ORAL | 0 refills | Status: DC | PRN

## 2017-05-14 MED ORDER — DILTIAZEM GEL 2 %: 1.0000 "application " | Freq: Three times a day (TID) | CUTANEOUS | 1 refills | Status: DC

## 2017-05-14 NOTE — ED Triage Notes (Addendum)
Bright red blood in stool on and off x 1week.  C/o rectal pain.  Came to ED a few days ago for same problem.  Pt reports he had to leave before being seen.  States his wife needed him to go to the grocery store.

## 2017-05-14 NOTE — Discharge Instructions (Signed)
Drink plenty of water. Increase dietary fiber (fruits/vegetables/beans/nuts). If you are having hard stools despite this, take colace (stool softener as needed). Take sitz baths 1-2x/day. Use diltiazem gel 3 times a day for 8 weeks.

## 2017-05-15 NOTE — ED Provider Notes (Signed)
[facility] EMERGENCY DEPARTMENT Provider Note   CSN: [id number] Arrival date & time: 05/14/17  1252     History   Chief Complaint Chief Complaint  Patient presents with  . Rectal Bleeding    HPI Bobby Ali is a 32 y.o. male.  HPI   33 year old male with rectal pain and and rectal bleeding.  He has had ongoing pain for about a week.  Intermittent stool with blood in it over the same time.  Rectal pain/discomfort which is sniffily worse when having a bowel movement.  Past Medical History:  Diagnosis Date  . Asthma   . GERD (gastroesophageal reflux disease)   . Hx of migraine headaches 1998   15 years per patient     Patient Active Problem List   Diagnosis Date Noted  . GERD (gastroesophageal reflux disease) 06/06/2013  . Pain in joint, shoulder region 08/02/2012  . Muscle weakness (generalized) 08/02/2012    Past Surgical History:  Procedure Laterality Date  . ESOPHAGOGASTRODUODENOSCOPY N/A 06/16/2013   Procedure: ESOPHAGOGASTRODUODENOSCOPY (EGD);  Surgeon: Malissa Hippo, MD;  Location: [facility];  Service: Endoscopy;  Laterality: N/A;  250-moved to 1155 Ann to notify pt  . FINGER FRACTURE SURGERY    . PILONIDAL CYST EXCISION         Home Medications    Prior to Admission medications   Medication Sig Start Date End Date Taking? Authorizing Provider  diltiazem 2 % GEL Apply 1 application topically 3 (three) times daily. 05/14/17   Raeford Razor, MD  docusate sodium (COLACE) 100 MG capsule Take 1 capsule (100 mg total) by mouth 2 (two) times daily as needed for mild constipation. 05/14/17   Raeford Razor, MD  naproxen (NAPROSYN) 500 MG tablet Take 1 tablet (500 mg total) by mouth 2 (two) times daily. 11/29/15   Arthor Captain, PA-C  oxyCODONE-acetaminophen (PERCOCET) 5-325 MG tablet Take 1-2 tablets by mouth every 6 (six) hours as needed for severe pain. 11/29/15   Arthor Captain, PA-C    Family History Family History  Problem Relation Age of Onset    . Colon cancer Neg Hx     Social History Social History   Tobacco Use  . Smoking status: Current Every Day Smoker    Packs/day: 1.00    Years: 7.00    Pack years: 7.00    Types: Cigarettes  . Smokeless tobacco: Never Used  . Tobacco comment: x 7 rs.   Substance Use Topics  . Alcohol use: Yes    Comment: occasional  . Drug use: No     Allergies   Patient has no known allergies.   Review of Systems Review of Systems  All systems reviewed and negative, other than as noted in HPI. Physical Exam Updated Vital Signs BP 127/84 (BP Location: Left Arm)   Pulse 72   Temp 98.1 F (36.7 C) (Oral)   Resp 18   Ht 6\' 1"  (1.854 m)   Wt 78.9 kg (174 lb)   SpO2 100%   BMI 22.96 kg/m   Physical Exam  Constitutional: He appears well-developed and well-nourished. No distress.  HENT:  Head: Normocephalic and atraumatic.  Eyes: Conjunctivae are normal. Right eye exhibits no discharge. Left eye exhibits no discharge.  Neck: Neck supple.  Cardiovascular: Normal rate, regular rhythm and normal heart sounds. Exam reveals no gallop and no friction rub.  No murmur heard. Pulmonary/Chest: Effort normal and breath sounds normal. No respiratory distress.  Abdominal: Soft. He exhibits no distension. There is  no tenderness.  Genitourinary:  Genitourinary Comments: Anal fissure at the 6 o'clock position.  No active bleeding.  No hemorrhoids.  Cannot tolerate DRE  Musculoskeletal: He exhibits no edema or tenderness.  Neurological: He is alert.  Skin: Skin is warm and dry.  Psychiatric: He has a normal mood and affect. His behavior is normal. Thought content normal.  Nursing note and vitals reviewed.    ED Treatments / Results  Labs (all labs ordered are listed, but only abnormal results are displayed) Labs Reviewed - No data to display  EKG  EKG Interpretation None       Radiology No results found.  Procedures Procedures (including critical care time)  Medications  Ordered in ED Medications - No data to display   Initial Impression / Assessment and Plan / ED Course  I have reviewed the triage vital signs and the nursing notes.  Pertinent labs & imaging results that were available during my care of the patient were reviewed by me and considered in my medical decision making (see chart for details).     32 year old male with rectal pain and bleeding.  Fissure noted on exam.  Diltiazem gel.  Plenty of fluids and increasing dietary fiber.  Stool softeners as needed.  Ibuprofen as needed for pain.  Sitz baths.    Final Clinical Impressions(s) / ED Diagnoses   Final diagnoses:  Anal fissure    ED Discharge Orders        Ordered    diltiazem 2 % GEL  3 times daily     05/14/17 1337    docusate sodium (COLACE) 100 MG capsule  2 times daily PRN     05/14/17 1337       Raeford RazorKohut, Jermika Olden, MD 05/15/17 1705

## 2017-07-02 ENCOUNTER — Encounter (HOSPITAL_COMMUNITY): Payer: Self-pay

## 2017-07-02 ENCOUNTER — Emergency Department (HOSPITAL_COMMUNITY)
Admission: EM | Admit: 2017-07-02 | Discharge: 2017-07-02 | Disposition: A | Discharge status: Home / Self Care | Payer: PRIVATE HEALTH INSURANCE | Attending: Emergency Medicine | Admitting: Emergency Medicine

## 2017-07-02 DIAGNOSIS — Y9389 Activity, other specified: Secondary | ICD-10-CM | POA: Diagnosis not present

## 2017-07-02 DIAGNOSIS — S3992XA Unspecified injury of lower back, initial encounter: Secondary | ICD-10-CM | POA: Diagnosis present

## 2017-07-02 DIAGNOSIS — M25561 Pain in right knee: Secondary | ICD-10-CM | POA: Insufficient documentation

## 2017-07-02 DIAGNOSIS — F17210 Nicotine dependence, cigarettes, uncomplicated: Secondary | ICD-10-CM | POA: Insufficient documentation

## 2017-07-02 DIAGNOSIS — Y9289 Other specified places as the place of occurrence of the external cause: Secondary | ICD-10-CM | POA: Insufficient documentation

## 2017-07-02 DIAGNOSIS — S39012A Strain of muscle, fascia and tendon of lower back, initial encounter: Secondary | ICD-10-CM | POA: Diagnosis not present

## 2017-07-02 DIAGNOSIS — Y990 Civilian activity done for income or pay: Secondary | ICD-10-CM | POA: Insufficient documentation

## 2017-07-02 DIAGNOSIS — J45909 Unspecified asthma, uncomplicated: Secondary | ICD-10-CM | POA: Diagnosis not present

## 2017-07-02 DIAGNOSIS — Y33XXXA Other specified events, undetermined intent, initial encounter: Secondary | ICD-10-CM | POA: Insufficient documentation

## 2017-07-02 MED ORDER — CYCLOBENZAPRINE HCL 10 MG PO TABS: 10.0000 mg | ORAL_TABLET | Freq: Three times a day (TID) | ORAL | 0 refills | Status: DC

## 2017-07-02 MED ORDER — IBUPROFEN 600 MG PO TABS: 600.0000 mg | ORAL_TABLET | Freq: Four times a day (QID) | ORAL | 0 refills | Status: DC

## 2017-07-02 NOTE — Discharge Instructions (Signed)
Please rest her back is much as possible.  Please use a heating pad, or use warm tub soaks daily.  Use Flexeril 3 times daily for spasm pain. This medication may cause drowsiness. Please do not drink, drive, or participate in activity that requires concentration while taking this medication.  Ibuprofen with breakfast, lunch, dinner, and at bedtime.  Please see Dr. Romeo AppleHarrison for additional evaluation and management if not improving.  You may also want to discuss your knee pain with him.

## 2017-07-02 NOTE — ED Triage Notes (Signed)
Pt c/o lower back pain and right knee pain for a couple of days. No known injuries.

## 2017-07-02 NOTE — ED Notes (Signed)
Pt c/o left lower stabbing back pain x 3days since doing heavy lifting at work.  Pt reports pain radiates to left leg.  Also c/o right knee pain with decreased ROM x 1wk.

## 2017-07-02 NOTE — ED Provider Notes (Signed)
[facility] EMERGENCY DEPARTMENT Provider Note   CSN: [id number] Arrival date & time: 07/02/17  0751     History   Chief Complaint Chief Complaint  Patient presents with  . Back Pain    HPI Bobby Ali is a 32 y.o. male.  The history is provided by the patient.  Back Pain   This is a new problem. The current episode started more than 2 days ago. The problem occurs hourly. The problem has been gradually worsening. The pain is associated with lifting heavy objects (pain after heavy lifting at work). The pain is present in the lumbar spine. The quality of the pain is described as stabbing. The pain radiates to the left thigh. The pain is moderate. The symptoms are aggravated by bending. The pain is the same all the time. Pertinent negatives include no chest pain, no fever, no numbness, no abdominal pain, no bowel incontinence, no perianal numbness, no bladder incontinence and no dysuria. He has tried NSAIDs for the symptoms.    Past Medical History:  Diagnosis Date  . Asthma   . GERD (gastroesophageal reflux disease)   . Hx of migraine headaches 1998   15 years per patient     Patient Active Problem List   Diagnosis Date Noted  . GERD (gastroesophageal reflux disease) 06/06/2013  . Pain in joint, shoulder region 08/02/2012  . Muscle weakness (generalized) 08/02/2012    Past Surgical History:  Procedure Laterality Date  . ESOPHAGOGASTRODUODENOSCOPY N/A 06/16/2013   Procedure: ESOPHAGOGASTRODUODENOSCOPY (EGD);  Surgeon: Malissa Hippo, MD;  Location: [facility];  Service: Endoscopy;  Laterality: N/A;  250-moved to 1155 Ann to notify pt  . FINGER FRACTURE SURGERY    . PILONIDAL CYST EXCISION         Home Medications    Prior to Admission medications   Medication Sig Start Date End Date Taking? Authorizing Provider  diltiazem 2 % GEL Apply 1 application topically 3 (three) times daily. 05/14/17   Raeford Razor, MD  docusate sodium (COLACE) 100 MG capsule Take 1  capsule (100 mg total) by mouth 2 (two) times daily as needed for mild constipation. 05/14/17   Raeford Razor, MD  naproxen (NAPROSYN) 500 MG tablet Take 1 tablet (500 mg total) by mouth 2 (two) times daily. 11/29/15   Arthor Captain, PA-C  oxyCODONE-acetaminophen (PERCOCET) 5-325 MG tablet Take 1-2 tablets by mouth every 6 (six) hours as needed for severe pain. 11/29/15   Arthor Captain, PA-C    Family History Family History  Problem Relation Age of Onset  . Colon cancer Neg Hx     Social History Social History   Tobacco Use  . Smoking status: Current Every Day Smoker    Packs/day: 1.50    Years: 7.00    Pack years: 10.50    Types: Cigarettes  . Smokeless tobacco: Never Used  . Tobacco comment: x 7 rs.   Substance Use Topics  . Alcohol use: Yes    Comment: occasional  . Drug use: No     Allergies   Patient has no known allergies.   Review of Systems Review of Systems  Constitutional: Negative for activity change and fever.       All ROS Neg except as noted in HPI  HENT: Negative for nosebleeds.   Eyes: Negative for photophobia and discharge.  Respiratory: Negative for cough, shortness of breath and wheezing.   Cardiovascular: Negative for chest pain and palpitations.  Gastrointestinal: Negative for abdominal pain, blood in  stool and bowel incontinence.  Genitourinary: Negative for bladder incontinence, dysuria, frequency and hematuria.  Musculoskeletal: Positive for arthralgias and back pain. Negative for neck pain.  Skin: Negative.   Neurological: Negative for dizziness, seizures, speech difficulty and numbness.  Psychiatric/Behavioral: Negative for confusion and hallucinations.     Physical Exam Updated Vital Signs BP 122/78 (BP Location: Left Arm)   Pulse 78   Temp 98.4 F (36.9 C) (Oral)   Resp 16   Ht 6\' 1"  (1.854 m)   Wt 83.9 kg (185 lb)   SpO2 100%   BMI 24.41 kg/m   Physical Exam  Constitutional: He is oriented to person, place, and time. He  appears well-developed and well-nourished.  Non-toxic appearance.  HENT:  Head: Normocephalic.  Right Ear: Tympanic membrane and external ear normal.  Left Ear: Tympanic membrane and external ear normal.  Eyes: EOM and lids are normal. Pupils are equal, round, and reactive to light.  Neck: Normal range of motion. Neck supple. Carotid bruit is not present.  Cardiovascular: Normal rate, regular rhythm, normal heart sounds, intact distal pulses and normal pulses.  Pulmonary/Chest: Breath sounds normal. No respiratory distress.  Abdominal: Soft. Bowel sounds are normal. There is no tenderness. There is no guarding.  Musculoskeletal: Normal range of motion. He exhibits tenderness.       Right knee: He exhibits normal patellar mobility. Tenderness found. Medial joint line and lateral joint line tenderness noted.       Lumbar back: He exhibits tenderness, pain and spasm.  Lymphadenopathy:       Head (right side): No submandibular adenopathy present.       Head (left side): No submandibular adenopathy present.    He has no cervical adenopathy.  Neurological: He is alert and oriented to person, place, and time. He has normal strength. No cranial nerve deficit or sensory deficit.  Skin: Skin is warm and dry.  Psychiatric: He has a normal mood and affect. His speech is normal.  Nursing note and vitals reviewed.    ED Treatments / Results  Labs (all labs ordered are listed, but only abnormal results are displayed) Labs Reviewed - No data to display  EKG  EKG Interpretation None       Radiology No results found.  Procedures Procedures (including critical care time)  Medications Ordered in ED Medications - No data to display   Initial Impression / Assessment and Plan / ED Course  I have reviewed the triage vital signs and the nursing notes.  Pertinent labs & imaging results that were available during my care of the patient were reviewed by me and considered in my medical decision  making (see chart for details).       Final Clinical Impressions(s) / ED Diagnoses MDM  Vital signs within normal limits.  There is been no loss of bowel or bladder function.  No evidence for cauda equina or other emergent changes.  The examination favors muscle strain.  The patient also has pain with flexion and extension of the right knee with some crepitus present.  I have asked the patient to use heating pad to the back, or warm tub soaks.  Prescription for Flexeril and ibuprofen is been ordered.  The patient is referred to orthopedics for orthopedic evaluation of the right knee as well as the lower back if this problem continues.  The patient is in agreement with this plan.  He is ambulatory at discharge.   Final diagnoses:  Strain of lumbar region, initial encounter  Right knee pain, unspecified chronicity    ED Discharge Orders        Ordered    cyclobenzaprine (FLEXERIL) 10 MG tablet  3 times daily     07/02/17 0923    ibuprofen (ADVIL,MOTRIN) 600 MG tablet  4 times daily     07/02/17 0923       Ivery QualeBryant, Yuya Vanwingerden, PA-C 07/02/17 [id number]    Bethann BerkshireZammit, Joseph, MD 07/02/17 [phone]

## 2017-07-25 IMAGING — CT CT RENAL STONE PROTOCOL
3 of 4 series · 9 of 46 positions shown, 16 images · non-contrast
Comparison: May 11, 2008

CLINICAL DATA: Left flank pain for 1 day

EXAM:
CT ABDOMEN AND PELVIS WITHOUT CONTRAST
TECHNIQUE: Multidetector CT imaging of the abdomen and pelvis was performed
following the standard protocol without oral or intravenous contrast
material administration.

[Series 3: lung 5.0 b60f · axial · 0.72mm/px · z∈[-63,+32]mm · 5 of 29 slices shown, 10 images]
[im 5/29  soft-tissue]
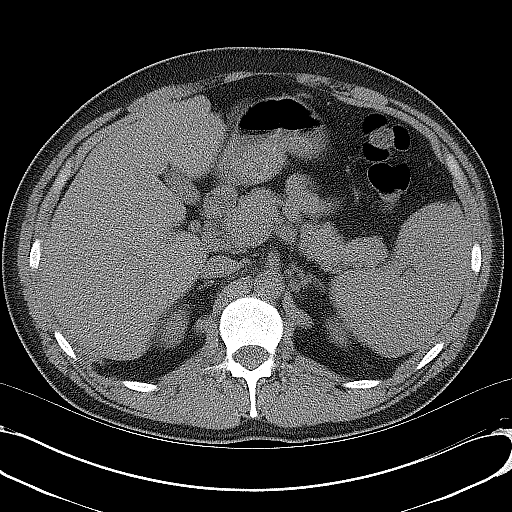
[im 5/29  bone]
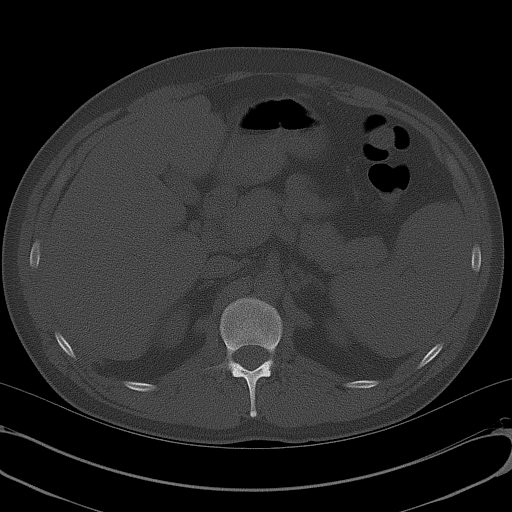
[im 10/29  soft-tissue]
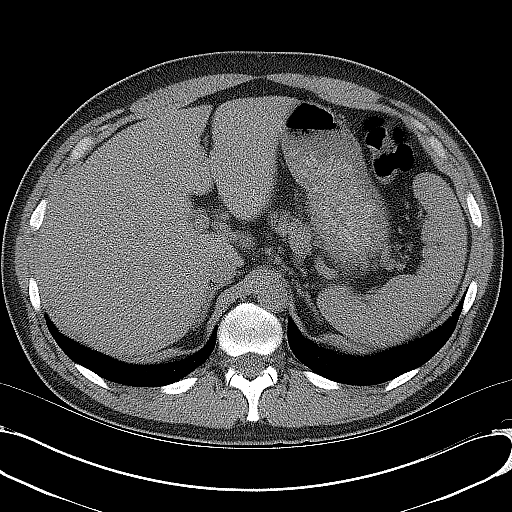
[im 10/29  lung]
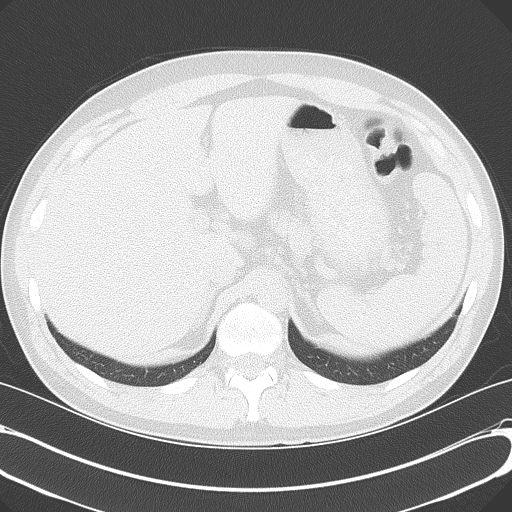
[im 15/29  soft-tissue]
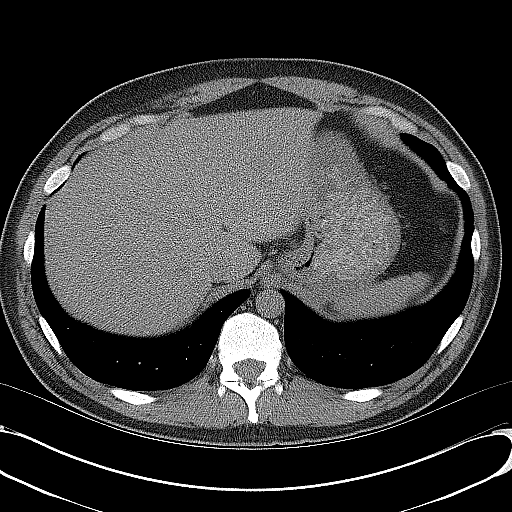
[im 15/29  lung]
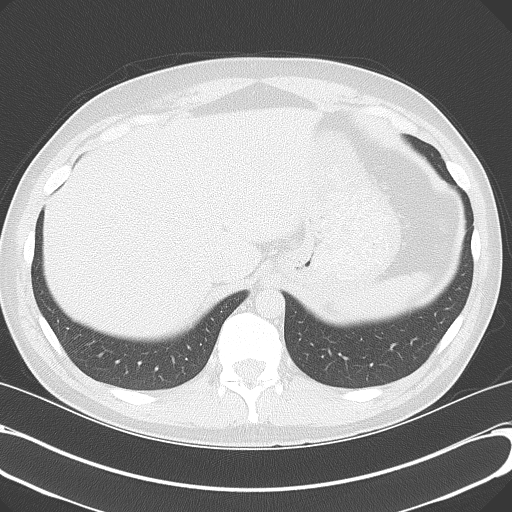
[im 19/29  soft-tissue]
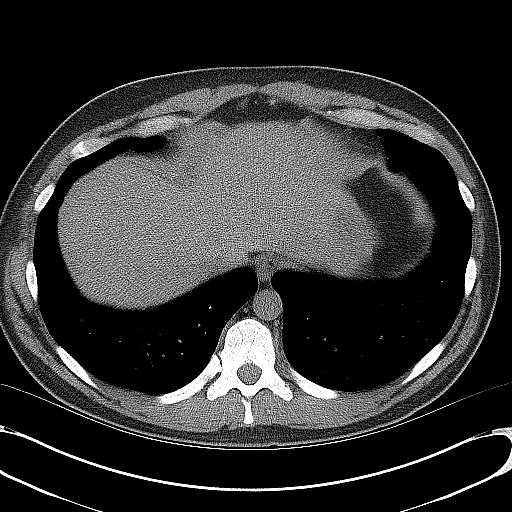
[im 19/29  lung]
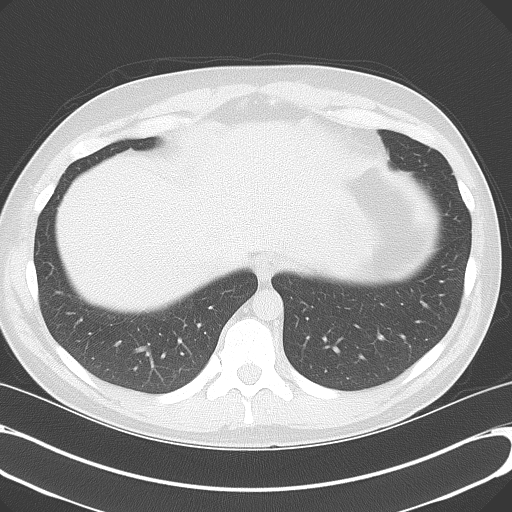
[im 24/29  soft-tissue]
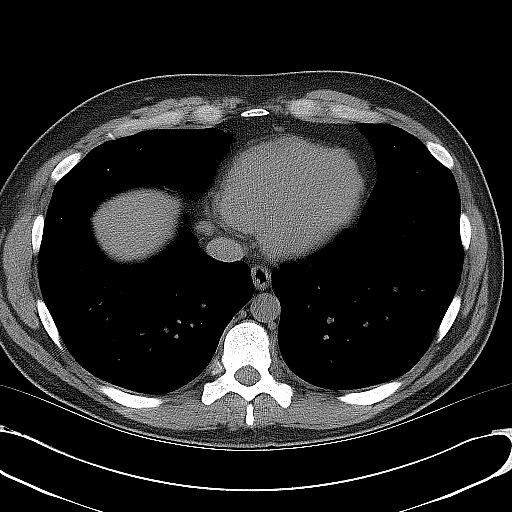
[im 24/29  lung]
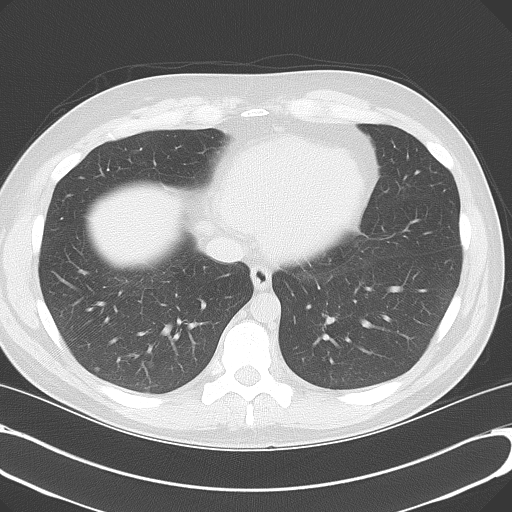

[Series 4: mpr coronal 3.0mm · coronal · 0.71mm/px · 3 of 91 slices shown, 4 images]
[im 31/91  soft-tissue]
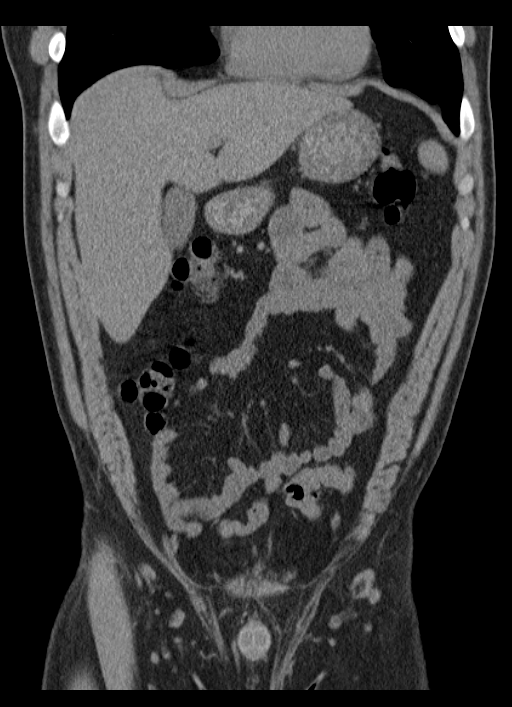
[im 41/91  soft-tissue]
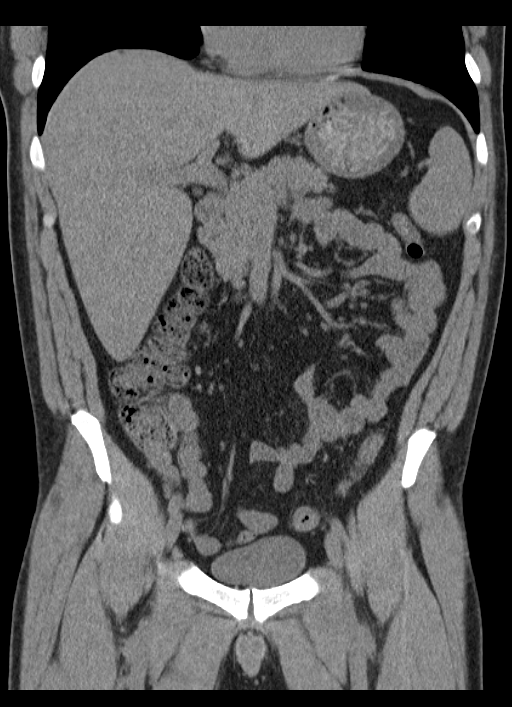
[im 41/91  bone]
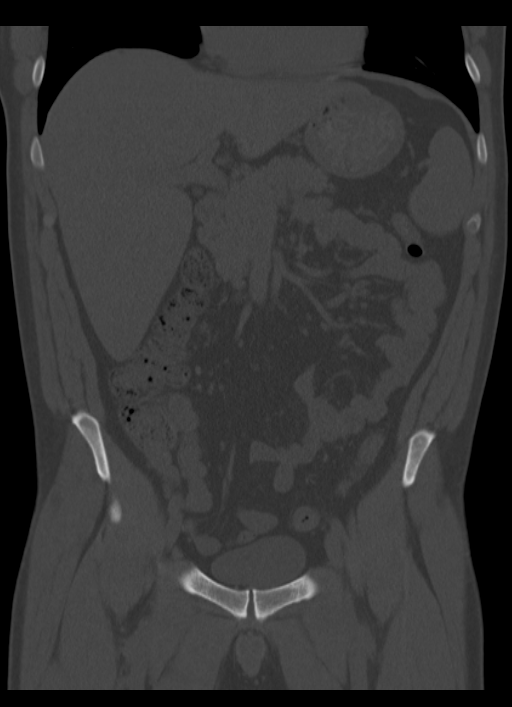
[im 51/91  soft-tissue]
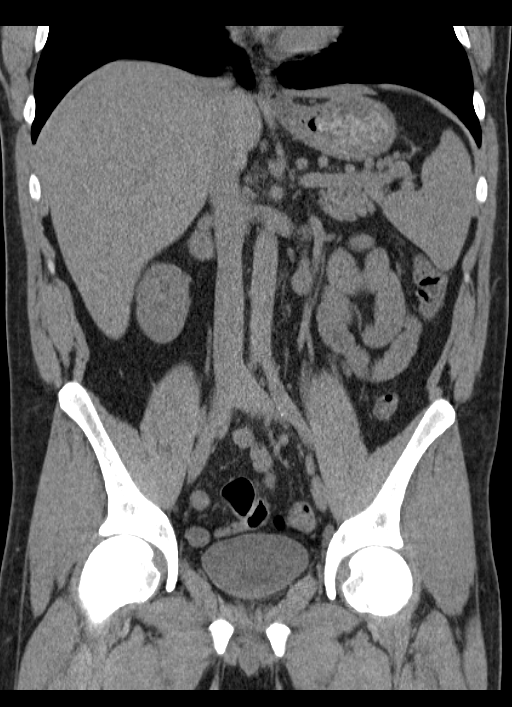

[Series 5: mpr sagittal 3.0mm · sagittal · 0.56mm/px · 1 of 122 slices shown, 2 images]
[im 41/122  soft-tissue]
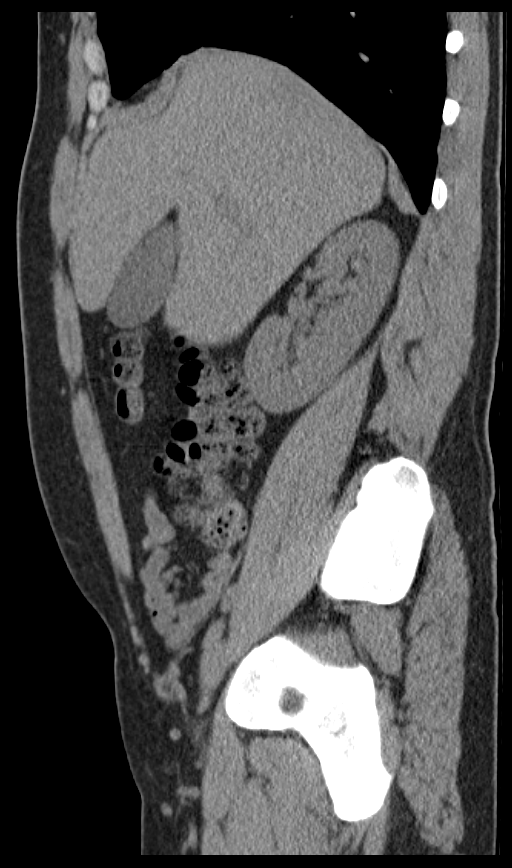
[im 41/122  bone]
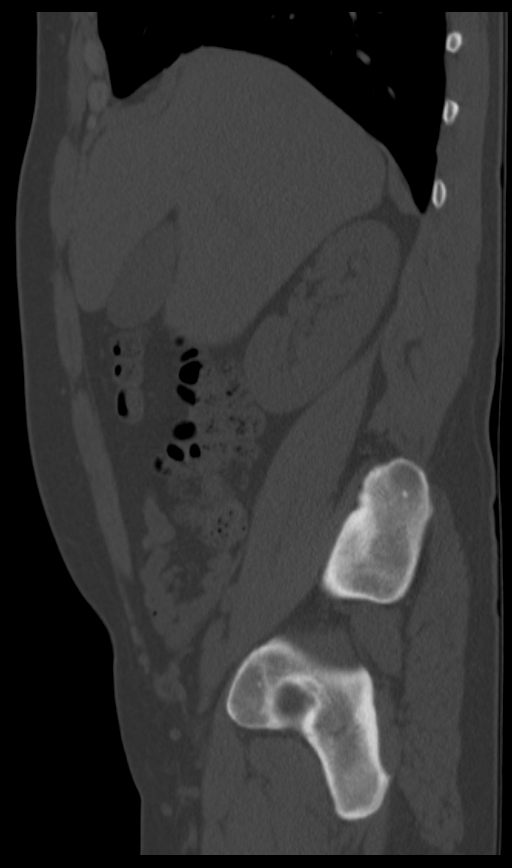

[9 of 46 positions shown; findings below may reference images not displayed]

FINDINGS: Lower chest: There is a stable 3 mm nodular opacity in the posterior
segment right lower lobe. There is a stable 4 mm nodular opacity
abutting the pleura in the lateral segment left lower lobe. Lung
bases otherwise are clear.

Hepatobiliary: Liver is prominent, measuring 22.3 cm in length. No
focal liver lesions are identified on this noncontrast enhanced
study. The gallbladder wall is not appreciably thickened. There is
no biliary duct dilatation.

Pancreas: No pancreatic mass or inflammatory focus.

Spleen: No splenic lesions are identified.

Adrenals/Urinary Tract: Adrenals appear normal bilaterally. There is
no renal mass or hydronephrosis on either side. There is no renal or
ureteral calculus on either side. Urinary bladder is midline with
wall thickness within normal limits.

Stomach/Bowel: There are scattered sigmoid diverticula without
diverticulitis. There is no bowel wall or mesenteric thickening. No
bowel obstruction. No free air or portal venous air.

Vascular/Lymphatic: There is no abdominal aortic aneurysm. No
vascular lesions are identified on this noncontrast enhanced study.
There is no demonstrable adenopathy in the abdomen or pelvis.

Reproductive: Prostate and seminal vesicles appear normal in size
and contour. There is no pelvic mass or pelvic fluid collection.

Other: Appendix appears normal. There is no abscess or ascites in
the abdomen or pelvis.

Musculoskeletal: There is disc narrowing at L4-5 and L5-S1. No
blastic or lytic bone lesions are identified. No intramuscular or
abdominal wall lesion apparent.
IMPRESSION: A cause for patient's symptoms has not been established with this
study. No renal or ureteral calculus. No hydronephrosis.

No bowel obstruction.  No abscess.  Appendix appears normal.

There are scattered sigmoid diverticula without diverticulitis.

Liver prominent without focal lesion.

Small nodular opacities in the lung bases are stable since 5949
study. Stability over this time interval is consistent with benign
etiology.

## 2017-09-04 ENCOUNTER — Other Ambulatory Visit: Payer: Self-pay

## 2017-09-04 ENCOUNTER — Encounter (HOSPITAL_COMMUNITY): Payer: Self-pay | Admitting: Emergency Medicine

## 2017-09-04 ENCOUNTER — Emergency Department (HOSPITAL_COMMUNITY)
Admission: EM | Admit: 2017-09-04 | Discharge: 2017-09-04 | Disposition: A | Discharge status: Home / Self Care | Payer: PRIVATE HEALTH INSURANCE | Attending: Emergency Medicine | Admitting: Emergency Medicine

## 2017-09-04 DIAGNOSIS — F17210 Nicotine dependence, cigarettes, uncomplicated: Secondary | ICD-10-CM | POA: Insufficient documentation

## 2017-09-04 DIAGNOSIS — M545 Low back pain: Secondary | ICD-10-CM | POA: Diagnosis present

## 2017-09-04 DIAGNOSIS — M5442 Lumbago with sciatica, left side: Secondary | ICD-10-CM | POA: Insufficient documentation

## 2017-09-04 DIAGNOSIS — J45909 Unspecified asthma, uncomplicated: Secondary | ICD-10-CM | POA: Diagnosis not present

## 2017-09-04 MED ORDER — PREDNISONE 10 MG PO TABS: ORAL_TABLET | ORAL | 0 refills | Status: DC

## 2017-09-04 MED ORDER — CYCLOBENZAPRINE HCL 10 MG PO TABS: 10.0000 mg | ORAL_TABLET | Freq: Three times a day (TID) | ORAL | 0 refills | Status: DC | PRN

## 2017-09-04 MED ORDER — NAPROXEN 500 MG PO TABS: 500.0000 mg | ORAL_TABLET | Freq: Two times a day (BID) | ORAL | 0 refills | Status: DC

## 2017-09-04 NOTE — ED Provider Notes (Signed)
[facility] EMERGENCY DEPARTMENT Provider Note   CSN: [id number] Arrival date & time: 09/04/17  0751     History   Chief Complaint Chief Complaint  Patient presents with  . Back Pain    HPI Bobby Ali is a 31 y.o. male.  HPI   Bobby Ali is a 32 y.o. male who presents to the Emergency Department complaining of diffuse low back pain with pain radiating down his left leg intermittently for 3 days.  He states this is a recurring problem.  He does admit to bending and heavy lifting at his job but denies known injury.  He states the pain is worse in the morning upon waking and improves throughout the day.  He has tried over-the-counter medications without relief.  He denies fever, chills, numbness or weakness of the lower extremities, abdominal pain, urine or bowel changes.    Past Medical History:  Diagnosis Date  . Asthma   . GERD (gastroesophageal reflux disease)   . Hx of migraine headaches 1998   15 years per patient     Patient Active Problem List   Diagnosis Date Noted  . GERD (gastroesophageal reflux disease) 06/06/2013  . Pain in joint, shoulder region 08/02/2012  . Muscle weakness (generalized) 08/02/2012    Past Surgical History:  Procedure Laterality Date  . ESOPHAGOGASTRODUODENOSCOPY N/A 06/16/2013   Procedure: ESOPHAGOGASTRODUODENOSCOPY (EGD);  Surgeon: Malissa Hippo, MD;  Location: [facility];  Service: Endoscopy;  Laterality: N/A;  250-moved to 1155 Ann to notify pt  . FINGER FRACTURE SURGERY    . PILONIDAL CYST EXCISION          Home Medications    Prior to Admission medications   Medication Sig Start Date End Date Taking? Authorizing Provider  cyclobenzaprine (FLEXERIL) 10 MG tablet Take 1 tablet (10 mg total) by mouth 3 (three) times daily as needed. 09/04/17   Alaira Level, PA-C  diltiazem 2 % GEL Apply 1 application topically 3 (three) times daily. 05/14/17   Raeford Razor, MD  docusate sodium (COLACE) 100 MG capsule Take 1 capsule  (100 mg total) by mouth 2 (two) times daily as needed for mild constipation. 05/14/17   Raeford Razor, MD  naproxen (NAPROSYN) 500 MG tablet Take 1 tablet (500 mg total) by mouth 2 (two) times daily with a meal. 09/04/17   Teruo Stilley, PA-C  oxyCODONE-acetaminophen (PERCOCET) 5-325 MG tablet Take 1-2 tablets by mouth every 6 (six) hours as needed for severe pain. 11/29/15   Arthor Captain, PA-C  predniSONE (DELTASONE) 10 MG tablet Take 6 tablets day one, 5 tablets day two, 4 tablets day three, 3 tablets day four, 2 tablets day five, then 1 tablet day six 09/04/17   Pauline Aus, PA-C    Family History Family History  Problem Relation Age of Onset  . Colon cancer Neg Hx     Social History Social History   Tobacco Use  . Smoking status: Current Every Day Smoker    Packs/day: 1.50    Years: 7.00    Pack years: 10.50    Types: Cigarettes  . Smokeless tobacco: Never Used  . Tobacco comment: x 7 rs.   Substance Use Topics  . Alcohol use: Yes    Comment: occasional  . Drug use: No     Allergies   Patient has no known allergies.   Review of Systems Review of Systems  Constitutional: Negative for fever.  Respiratory: Negative for shortness of breath.   Cardiovascular: Negative for  chest pain.  Gastrointestinal: Negative for abdominal pain, constipation and vomiting.  Genitourinary: Negative for decreased urine volume, difficulty urinating, dysuria and flank pain.  Musculoskeletal: Positive for back pain. Negative for joint swelling.  Skin: Negative for rash.  Neurological: Negative for dizziness, weakness and numbness.  All other systems reviewed and are negative.    Physical Exam Updated Vital Signs BP (!) 135/98   Pulse 83   Temp 98.1 F (36.7 C)   Resp 18   Ht  (1.854 m)   Wt 82.6 kg (182 lb)   SpO2 97%   BMI 24.01 kg/m   Physical Exam  Constitutional: He is oriented to person, place, and time. He appears well-developed and well-nourished. No distress.    HENT:  Head: Atraumatic.  Cardiovascular: Normal rate, regular rhythm, normal heart sounds and intact distal pulses.  No murmur heard. Pulmonary/Chest: Effort normal and breath sounds normal. No respiratory distress.  Abdominal: Soft. He exhibits no distension and no mass. There is no tenderness. There is no guarding.  Musculoskeletal: Normal range of motion. He exhibits no edema or tenderness.  Diffuse tenderness to palpation of the lower lumbar spine and bilateral paraspinal muscles.  Positive straight leg raise on the left at 30 degrees.  No motor weakness of the lower extremities.  Neurological: He is alert and oriented to person, place, and time. No sensory deficit. He exhibits normal muscle tone. Coordination normal.  Skin: Skin is warm. Capillary refill takes less than 2 seconds. No rash noted.  Psychiatric: He has a normal mood and affect.  Nursing note and vitals reviewed.    ED Treatments / Results  Labs (all labs ordered are listed, but only abnormal results are displayed) Labs Reviewed - No data to display  EKG None  Radiology No results found.  Procedures Procedures (including critical care time)  Medications Ordered in ED Medications - No data to display   Initial Impression / Assessment and Plan / ED Course  I have reviewed the triage vital signs and the nursing notes.  Pertinent labs & imaging results that were available during my care of the patient were reviewed by me and considered in my medical decision making (see chart for details).     Patient with likely acute on chronic low back pain.  Some mild left-sided sciatica noted.  He is ambulatory with a steady gait.  No focal neuro deficits.  No concerning symptoms for cauda equina or other emergent process.  Patient agrees to treatment plan with steroids, muscle relaxer, and NSAID therapy.  Referral information provided for him to establish primary care. Return precautions discussed  Final Clinical  Impressions(s) / ED Diagnoses   Final diagnoses:  Acute bilateral low back pain with left-sided sciatica    ED Discharge Orders        Ordered    cyclobenzaprine (FLEXERIL) 10 MG tablet  3 times daily PRN     09/04/17 0841    naproxen (NAPROSYN) 500 MG tablet  2 times daily with meals     09/04/17 0841    predniSONE (DELTASONE) 10 MG tablet     09/04/17 0841       Pauline Aus, PA-C 09/04/17 0904    Donnetta Hutching, MD 09/05/17 [phone]

## 2017-09-04 NOTE — Discharge Instructions (Addendum)
Alternate ice and heat to your back.  Take the medications as directed.  You can contact the clinic listed to arrange a follow-up appt in one week if the pain is not improving

## 2017-09-04 NOTE — ED Triage Notes (Signed)
PT c/o lower back pain radiating down left leg x3 days with no injury. PT ambulatory from triage and denies any bowel or bladder issues.

## 2017-10-14 ENCOUNTER — Encounter (HOSPITAL_COMMUNITY): Payer: Self-pay | Admitting: Emergency Medicine

## 2017-10-14 ENCOUNTER — Emergency Department (HOSPITAL_COMMUNITY)
Admission: EM | Admit: 2017-10-14 | Discharge: 2017-10-14 | Disposition: A | Discharge status: Home / Self Care | Payer: PRIVATE HEALTH INSURANCE | Attending: Emergency Medicine | Admitting: Emergency Medicine

## 2017-10-14 ENCOUNTER — Other Ambulatory Visit: Payer: Self-pay

## 2017-10-14 DIAGNOSIS — J45909 Unspecified asthma, uncomplicated: Secondary | ICD-10-CM | POA: Diagnosis not present

## 2017-10-14 DIAGNOSIS — K0889 Other specified disorders of teeth and supporting structures: Secondary | ICD-10-CM | POA: Insufficient documentation

## 2017-10-14 DIAGNOSIS — F17210 Nicotine dependence, cigarettes, uncomplicated: Secondary | ICD-10-CM | POA: Diagnosis not present

## 2017-10-14 MED ORDER — CLINDAMYCIN HCL 300 MG PO CAPS: 300.0000 mg | ORAL_CAPSULE | Freq: Four times a day (QID) | ORAL | 0 refills | Status: DC

## 2017-10-14 MED ORDER — DICLOFENAC SODIUM 75 MG PO TBEC: 75.0000 mg | DELAYED_RELEASE_TABLET | Freq: Two times a day (BID) | ORAL | 0 refills | Status: DC

## 2017-10-14 NOTE — ED Notes (Signed)
Patient given discharge instruction, verbalized understand. Patient ambulatory out of the department.  

## 2017-10-14 NOTE — ED Notes (Signed)
Right lower dental pain, assessed by PA

## 2017-10-14 NOTE — Discharge Instructions (Addendum)
Follow-up with your dentist soon.  Return here for any worsening symptoms such as fever, facial swelling, neck pain or difficulty swallowing

## 2017-10-14 NOTE — ED Triage Notes (Signed)
Right upper dental pain x 2 days. Taking ibuprofen with no relief. Nad.minimal swelling noted to right side of jaw

## 2017-10-14 NOTE — ED Provider Notes (Signed)
[facility] EMERGENCY DEPARTMENT Provider Note   CSN: [id number] Arrival date & time: 10/14/17  1653     History   Chief Complaint Chief Complaint  Patient presents with  . Dental Pain    HPI Bobby Ali is a 32 y.o. male.  HPI   Bobby Ali is a 32 y.o. male who presents to the Emergency Department complaining of right lower dental pain.  Symptoms present for 2 days.  Describes a throbbing pain to his teeth that worsens with chewing and cold foods or liquids.  He admits to chronic dental decay to multiple teeth.  Waiting for his insurance to become effective.  He denies facial swelling, neck pain, fever or chills, difficulty swallowing or opening his mouth.  Taking over the counter pain relievers without relief.     Past Medical History:  Diagnosis Date  . Asthma   . GERD (gastroesophageal reflux disease)   . Hx of migraine headaches 1998   15 years per patient     Patient Active Problem List   Diagnosis Date Noted  . GERD (gastroesophageal reflux disease) 06/06/2013  . Pain in joint, shoulder region 08/02/2012  . Muscle weakness (generalized) 08/02/2012    Past Surgical History:  Procedure Laterality Date  . ESOPHAGOGASTRODUODENOSCOPY N/A 06/16/2013   Procedure: ESOPHAGOGASTRODUODENOSCOPY (EGD);  Surgeon: Malissa HippoNajeeb U Rehman, MD;  Location: [facility];  Service: Endoscopy;  Laterality: N/A;  250-moved to 1155 Ann to notify pt  . FINGER FRACTURE SURGERY    . PILONIDAL CYST EXCISION          Home Medications    Prior to Admission medications   Medication Sig Start Date End Date Taking? Authorizing Provider  clindamycin (CLEOCIN) 300 MG capsule Take 1 capsule (300 mg total) by mouth 4 (four) times daily. 10/14/17   Jshawn Hurta, PA-C  cyclobenzaprine (FLEXERIL) 10 MG tablet Take 1 tablet (10 mg total) by mouth 3 (three) times daily as needed. 09/04/17   Sammi Stolarz, PA-C  diclofenac (VOLTAREN) 75 MG EC tablet Take 1 tablet (75 mg total) by mouth 2 (two)  times daily. Take with food 10/14/17   Makaveli Hoard, PA-C  diltiazem 2 % GEL Apply 1 application topically 3 (three) times daily. 05/14/17   Raeford RazorKohut, Stephen, MD  docusate sodium (COLACE) 100 MG capsule Take 1 capsule (100 mg total) by mouth 2 (two) times daily as needed for mild constipation. 05/14/17   Raeford RazorKohut, Stephen, MD  naproxen (NAPROSYN) 500 MG tablet Take 1 tablet (500 mg total) by mouth 2 (two) times daily with a meal. 09/04/17   Nickolaos Brallier, PA-C  oxyCODONE-acetaminophen (PERCOCET) 5-325 MG tablet Take 1-2 tablets by mouth every 6 (six) hours as needed for severe pain. 11/29/15   Arthor CaptainHarris, Abigail, PA-C  predniSONE (DELTASONE) 10 MG tablet Take 6 tablets day one, 5 tablets day two, 4 tablets day three, 3 tablets day four, 2 tablets day five, then 1 tablet day six 09/04/17   Pauline Ausriplett, Henning Ehle, PA-C    Family History Family History  Problem Relation Age of Onset  . Colon cancer Neg Hx     Social History Social History   Tobacco Use  . Smoking status: Current Every Day Smoker    Packs/day: 1.50    Years: 7.00    Pack years: 10.50    Types: Cigarettes  . Smokeless tobacco: Never Used  . Tobacco comment: x 7 rs.   Substance Use Topics  . Alcohol use: Yes    Comment: occasional  .  Drug use: No     Allergies   Patient has no known allergies.   Review of Systems Review of Systems  Constitutional: Negative for appetite change and fever.  HENT: Positive for dental problem. Negative for congestion, ear pain, facial swelling, sore throat and trouble swallowing.   Eyes: Negative for pain and visual disturbance.  Musculoskeletal: Negative for neck pain and neck stiffness.  Neurological: Negative for dizziness, facial asymmetry and headaches.  Hematological: Negative for adenopathy.  All other systems reviewed and are negative.    Physical Exam Updated Vital Signs BP 134/89 (BP Location: Right Arm)   Pulse (!) 105   Temp 98.1 F (36.7 C) (Oral)   Resp 17   SpO2 100%    Physical Exam  Constitutional: He is oriented to person, place, and time. He appears well-developed and well-nourished. No distress.  HENT:  Head: Normocephalic and atraumatic.  Right Ear: Tympanic membrane and ear canal normal.  Left Ear: Tympanic membrane and ear canal normal.  Mouth/Throat: Uvula is midline, oropharynx is clear and moist and mucous membranes are normal. No trismus in the jaw. Dental caries present. No dental abscesses or uvula swelling.  tenderness and dental caries of the right lower second and third molars.  No facial swelling, obvious dental abscess, trismus, or sublingual abnml.    Neck: Normal range of motion. Neck supple.  Cardiovascular: Normal rate, regular rhythm and normal heart sounds.  No murmur heard. Pulmonary/Chest: Effort normal and breath sounds normal.  Musculoskeletal: Normal range of motion.  Lymphadenopathy:    He has no cervical adenopathy.  Neurological: He is alert and oriented to person, place, and time. He exhibits normal muscle tone. Coordination normal.  Skin: Skin is warm and dry.  Nursing note and vitals reviewed.    ED Treatments / Results  Labs (all labs ordered are listed, but only abnormal results are displayed) Labs Reviewed - No data to display  EKG None  Radiology No results found.  Procedures Procedures (including critical care time)  Medications Ordered in ED Medications - No data to display   Initial Impression / Assessment and Plan / ED Course  I have reviewed the triage vital signs and the nursing notes.  Pertinent labs & imaging results that were available during my care of the patient were reviewed by me and considered in my medical decision making (see chart for details).     Pt well appearing.  No facial edema, airway patent.  No obvious dental abscess or concerning sx's for ludwig's angina.  Referral info provided for dentistry.  Return precautions discussed.   Final Clinical Impressions(s) / ED  Diagnoses   Final diagnoses:  Pain, dental    ED Discharge Orders        Ordered    clindamycin (CLEOCIN) 300 MG capsule  4 times daily     10/14/17 1738    diclofenac (VOLTAREN) 75 MG EC tablet  2 times daily     10/14/17 1738       Pauline Aus, PA-C 10/14/17 Vashti Hey, MD 10/14/17 [phone]

## 2017-12-26 ENCOUNTER — Other Ambulatory Visit: Payer: Self-pay

## 2017-12-26 ENCOUNTER — Emergency Department (HOSPITAL_COMMUNITY)
Admission: EM | Admit: 2017-12-26 | Discharge: 2017-12-26 | Disposition: A | Discharge status: Home / Self Care | Payer: PRIVATE HEALTH INSURANCE | Attending: Emergency Medicine | Admitting: Emergency Medicine

## 2017-12-26 ENCOUNTER — Encounter (HOSPITAL_COMMUNITY): Payer: Self-pay | Admitting: Emergency Medicine

## 2017-12-26 DIAGNOSIS — J45909 Unspecified asthma, uncomplicated: Secondary | ICD-10-CM | POA: Insufficient documentation

## 2017-12-26 DIAGNOSIS — Z79899 Other long term (current) drug therapy: Secondary | ICD-10-CM | POA: Insufficient documentation

## 2017-12-26 DIAGNOSIS — J069 Acute upper respiratory infection, unspecified: Secondary | ICD-10-CM | POA: Insufficient documentation

## 2017-12-26 DIAGNOSIS — F17210 Nicotine dependence, cigarettes, uncomplicated: Secondary | ICD-10-CM | POA: Insufficient documentation

## 2017-12-26 DIAGNOSIS — J329 Chronic sinusitis, unspecified: Secondary | ICD-10-CM | POA: Insufficient documentation

## 2017-12-26 MED ORDER — DEXAMETHASONE 4 MG PO TABS: 4.0000 mg | ORAL_TABLET | Freq: Two times a day (BID) | ORAL | 0 refills | Status: DC

## 2017-12-26 MED ORDER — DIPHENHYDRAMINE HCL 12.5 MG/5ML PO ELIX
25.0000 mg | ORAL_SOLUTION | Freq: Once | ORAL | Status: AC
  Administered 2017-12-26: 25 mg via ORAL
  Filled 2017-12-26: qty 10

## 2017-12-26 MED ORDER — DOXYCYCLINE HYCLATE 100 MG PO TABS
100.0000 mg | ORAL_TABLET | Freq: Once | ORAL | Status: AC
  Administered 2017-12-26: 100 mg via ORAL
  Filled 2017-12-26: qty 1

## 2017-12-26 MED ORDER — DOXYCYCLINE HYCLATE 100 MG PO CAPS: 100.0000 mg | ORAL_CAPSULE | Freq: Two times a day (BID) | ORAL | 0 refills | Status: DC

## 2017-12-26 MED ORDER — HYDROCODONE-HOMATROPINE 5-1.5 MG/5ML PO SYRP: 5.0000 mL | ORAL_SOLUTION | Freq: Four times a day (QID) | ORAL | 0 refills | Status: DC | PRN

## 2017-12-26 MED ORDER — PREDNISONE 20 MG PO TABS
40.0000 mg | ORAL_TABLET | Freq: Once | ORAL | Status: AC
  Administered 2017-12-26: 40 mg via ORAL
  Filled 2017-12-26: qty 2

## 2017-12-26 MED ORDER — LORATADINE-PSEUDOEPHEDRINE ER 5-120 MG PO TB12: 1.0000 | ORAL_TABLET | Freq: Two times a day (BID) | ORAL | 0 refills | Status: DC

## 2017-12-26 NOTE — Discharge Instructions (Addendum)
Your vital signs are within normal limits.  Your oxygen is 99% on room air.  Within normal limits by my interpretation.  Your examination favors sinusitis and bronchitis.  Please wash hands frequently.  Please increase fluids.  Please stop smoking. Use Claritin-D , doxycycline and Decadron 2 times daily or every 12 hours.  Please take these medicines with food.  Use Hycodan for cough. This medication may cause drowsiness. Please do not drink, drive, or participate in activity that requires concentration while taking this medication.  Please see your primary physician or return to the emergency department if any changes in your condition, problems, or concerns.

## 2017-12-26 NOTE — ED Provider Notes (Signed)
[facility] EMERGENCY DEPARTMENT Provider Note   CSN: [id number] Arrival date & time: 12/26/17  1759     History   Chief Complaint Chief Complaint  Patient presents with  . Cough    HPI Bobby Ali is a 32 y.o. male.   Patient is a 32 year old male who presents to the emergency department with a complaint of cough.  The patient states this problem started about 3 days ago.  He has been having productive cough with mostly clear to cloudy phlegm.  He has had problems with nasal congestion, some discomfort of the throat, but mostly related to cough.  He is also had some problems with headache recently.  Is been no fever or chills reported.  No hemoptysis reported.  No recent injury or procedure involving the chest or lungs.  It is of note that his son is recently been treated for upper respiratory infection.  The patient is a smoker.  And he says at at times he is around chemicals and gases.   The history is provided by the patient.   Cough   Associated symptoms include myalgias. Pertinent negatives include no chest pain, no chills, no shortness of breath and no wheezing.    Past Medical History:  Diagnosis Date  . Asthma   . GERD (gastroesophageal reflux disease)   . Hx of migraine headaches 1998   15 years per patient     Patient Active Problem List   Diagnosis Date Noted  . GERD (gastroesophageal reflux disease) 06/06/2013  . Pain in joint, shoulder region 08/02/2012  . Muscle weakness (generalized) 08/02/2012    Past Surgical History:  Procedure Laterality Date  . ESOPHAGOGASTRODUODENOSCOPY N/A 06/16/2013   Procedure: ESOPHAGOGASTRODUODENOSCOPY (EGD);  Surgeon: Malissa Hippo, MD;  Location: [facility];  Service: Endoscopy;  Laterality: N/A;  250-moved to 1155 Ann to notify pt  . FINGER FRACTURE SURGERY    . PILONIDAL CYST EXCISION          Home Medications    Prior to Admission medications   Medication Sig Start Date End Date Taking? Authorizing  Provider  clindamycin (CLEOCIN) 300 MG capsule Take 1 capsule (300 mg total) by mouth 4 (four) times daily. 10/14/17   Triplett, Tammy, PA-C  cyclobenzaprine (FLEXERIL) 10 MG tablet Take 1 tablet (10 mg total) by mouth 3 (three) times daily as needed. 09/04/17   Triplett, Tammy, PA-C  dexamethasone (DECADRON) 4 MG tablet Take 1 tablet (4 mg total) by mouth 2 (two) times daily with a meal. 12/26/17   Ivery Quale, PA-C  diclofenac (VOLTAREN) 75 MG EC tablet Take 1 tablet (75 mg total) by mouth 2 (two) times daily. Take with food 10/14/17   Triplett, Tammy, PA-C  diltiazem 2 % GEL Apply 1 application topically 3 (three) times daily. 05/14/17   Raeford Razor, MD  docusate sodium (COLACE) 100 MG capsule Take 1 capsule (100 mg total) by mouth 2 (two) times daily as needed for mild constipation. 05/14/17   Raeford Razor, MD  doxycycline (VIBRAMYCIN) 100 MG capsule Take 1 capsule (100 mg total) by mouth 2 (two) times daily. 12/26/17   Ivery Quale, PA-C  HYDROcodone-homatropine [facility]) 5-1.5 MG/5ML syrup Take 5 mLs by mouth every 6 (six) hours as needed. 12/26/17   Ivery Quale, PA-C  loratadine-pseudoephedrine (CLARITIN-D 12 HOUR) 5-120 MG tablet Take 1 tablet by mouth 2 (two) times daily. 12/26/17   Ivery Quale, PA-C  naproxen (NAPROSYN) 500 MG tablet Take 1 tablet (500 mg total) by mouth  2 (two) times daily with a meal. 09/04/17   Triplett, Tammy, PA-C  oxyCODONE-acetaminophen (PERCOCET) 5-325 MG tablet Take 1-2 tablets by mouth every 6 (six) hours as needed for severe pain. 11/29/15   Arthor Captain, PA-C  predniSONE (DELTASONE) 10 MG tablet Take 6 tablets day one, 5 tablets day two, 4 tablets day three, 3 tablets day four, 2 tablets day five, then 1 tablet day six 09/04/17   Pauline Aus, PA-C    Family History Family History  Problem Relation Age of Onset  . Colon cancer Neg Hx     Social History Social History   Tobacco Use  . Smoking status: Current Every Day Smoker    Packs/day: 1.50     Years: 7.00    Pack years: 10.50    Types: Cigarettes  . Smokeless tobacco: Never Used  . Tobacco comment: x 7 rs.   Substance Use Topics  . Alcohol use: Yes    Comment: occasional  . Drug use: No     Allergies   Patient has no known allergies.   Review of Systems Review of Systems  Constitutional: Negative for activity change, chills and fever.       All ROS Neg except as noted in HPI  HENT: Positive for congestion, postnasal drip and sinus pressure. Negative for nosebleeds.   Eyes: Negative for photophobia and discharge.  Respiratory: Positive for cough and chest tightness. Negative for shortness of breath, wheezing and stridor.   Cardiovascular: Negative for chest pain and palpitations.  Gastrointestinal: Negative for abdominal pain and blood in stool.  Genitourinary: Negative for dysuria, frequency and hematuria.  Musculoskeletal: Positive for myalgias. Negative for arthralgias, back pain and neck pain.  Skin: Negative.   Neurological: Negative for dizziness, seizures and speech difficulty.  Psychiatric/Behavioral: Negative for confusion and hallucinations.     Physical Exam Updated Vital Signs BP 135/80 (BP Location: Right Arm)   Pulse 92   Temp 97.8 F (36.6 C) (Oral)   Resp 18   Ht 6\' 1"  (1.854 m)   Wt 79.8 kg   SpO2 99%   BMI 23.22 kg/m    Physical Exam  Constitutional: He is oriented to person, place, and time. He appears well-developed and well-nourished.  Non-toxic appearance.  HENT:  Head: Normocephalic.  Right Ear: Tympanic membrane and external ear normal.  Left Ear: Tympanic membrane and external ear normal.  Nasal congestion present. The uvula is slightly enlarged, but midline.  There is mild increased redness of the posterior pharynx.  The airway is patent.  Eyes: Pupils are equal, round, and reactive to light. EOM and lids are normal.  Neck: Normal range of motion. Neck supple. Carotid bruit is not present.  Cardiovascular: Normal rate,  regular rhythm, normal heart sounds, intact distal pulses and normal pulses.  Pulmonary/Chest: Breath sounds normal. No stridor. No respiratory distress. He has no wheezes.  Abdominal: Soft. Bowel sounds are normal. There is no tenderness. There is no guarding.  Musculoskeletal: Normal range of motion.  Lymphadenopathy:       Head (right side): No submandibular adenopathy present.       Head (left side): No submandibular adenopathy present.    He has no cervical adenopathy.  Neurological: He is alert and oriented to person, place, and time. He has normal strength. No cranial nerve deficit or sensory deficit.  Skin: Skin is warm and dry.  Psychiatric: He has a normal mood and affect. His speech is normal.  Nursing note and vitals reviewed.  ED Treatments / Results  Labs (all labs ordered are listed, but only abnormal results are displayed) Labs Reviewed - No data to display  EKG None  Radiology No results found.  Procedures Procedures (including critical care time)  Medications Ordered in ED Medications  predniSONE (DELTASONE) tablet 40 mg (40 mg Oral Given 12/26/17 1826)  doxycycline (VIBRA-TABS) tablet 100 mg (100 mg Oral Given 12/26/17 1827)  diphenhydrAMINE (BENADRYL) 12.5 MG/5ML elixir 25 mg (25 mg Oral Given 12/26/17 1827)     Initial Impression / Assessment and Plan / ED Course  I have reviewed the triage vital signs and the nursing notes.  Pertinent labs & imaging results that were available during my care of the patient were reviewed by me and considered in my medical decision making (see chart for details).       Final Clinical Impressions(s) / ED Diagnoses MDM  Vital signs are within normal limits.  Pulse oximetry is 99% on room air.  Within normal limits by my interpretation. Lungs are mostly clear.  The examination favors sinusitis and possibly some early bronchitis.  Patient will be treated with Hycodan 100 mL.  Short course of steroid, doxycycline  (patient is a smoker).  Patient is to follow-up with his primary physician or return to the emergency department if any changes or problems.  I have also asked the patient to stop smoking.  Patient is in agreement with this plan.   Final diagnoses:  Sinusitis, unspecified chronicity, unspecified location  Acute upper respiratory infection    ED Discharge Orders         Ordered    dexamethasone (DECADRON) 4 MG tablet  2 times daily with meals     12/26/17 1822    doxycycline (VIBRAMYCIN) 100 MG capsule  2 times daily     12/26/17 1822    loratadine-pseudoephedrine (CLARITIN-D 12 HOUR) 5-120 MG tablet  2 times daily     12/26/17 1822    HYDROcodone-homatropine (HYCODAN) 5-1.5 MG/5ML syrup  Every 6 hours PRN     12/26/17 1822          Ivery QualeBryant, Richetta Cubillos, PA-C 12/26/17 1847  Mancel BaleWentz, Elliott, MD 12/27/17 1134

## 2017-12-26 NOTE — ED Triage Notes (Addendum)
Patient c/o productive cough with chest congestion that started 3 days ago. Per patient thick, clear sputum. Patient reports nasal drainage. Denies any fevers, sore throat, or ear pain.

## 2019-03-01 ENCOUNTER — Other Ambulatory Visit: Payer: Self-pay | Admitting: *Deleted

## 2019-03-01 DIAGNOSIS — Z20822 Contact with and (suspected) exposure to covid-19: Secondary | ICD-10-CM

## 2019-03-02 LAB — NOVEL CORONAVIRUS, NAA: SARS-CoV-2, NAA: NOT DETECTED

## 2019-06-27 ENCOUNTER — Ambulatory Visit: Payer: PRIVATE HEALTH INSURANCE

## 2019-06-27 ENCOUNTER — Other Ambulatory Visit: Payer: Self-pay

## 2019-06-27 ENCOUNTER — Ambulatory Visit: Payer: PRIVATE HEALTH INSURANCE | Attending: Internal Medicine

## 2019-06-27 DIAGNOSIS — Z20822 Contact with and (suspected) exposure to covid-19: Secondary | ICD-10-CM

## 2019-06-28 LAB — NOVEL CORONAVIRUS, NAA: SARS-CoV-2, NAA: NOT DETECTED

## 2019-07-19 ENCOUNTER — Other Ambulatory Visit: Payer: Self-pay

## 2019-07-19 ENCOUNTER — Ambulatory Visit: Payer: PRIVATE HEALTH INSURANCE | Attending: Internal Medicine

## 2019-07-19 DIAGNOSIS — Z20822 Contact with and (suspected) exposure to covid-19: Secondary | ICD-10-CM

## 2019-07-20 LAB — NOVEL CORONAVIRUS, NAA: SARS-CoV-2, NAA: NOT DETECTED

## 2019-10-20 ENCOUNTER — Ambulatory Visit
Admission: RE | Admit: 2019-10-20 | Discharge: 2019-10-20 | Disposition: A | Discharge status: Home / Self Care | Payer: PRIVATE HEALTH INSURANCE | Source: Ambulatory Visit | Attending: Family Medicine | Admitting: Family Medicine

## 2019-10-20 VITALS — BP 129/91 | HR 92 | Temp 97.9°F | Resp 17

## 2019-10-20 DIAGNOSIS — G43801 Other migraine, not intractable, with status migrainosus: Secondary | ICD-10-CM | POA: Diagnosis not present

## 2019-10-20 MED ORDER — DEXAMETHASONE SODIUM PHOSPHATE 10 MG/ML IJ SOLN
10.0000 mg | Freq: Once | INTRAMUSCULAR | Status: AC
  Administered 2019-10-20: 10 mg via INTRAMUSCULAR

## 2019-10-20 MED ORDER — KETOROLAC TROMETHAMINE 60 MG/2ML IM SOLN
60.0000 mg | Freq: Once | INTRAMUSCULAR | Status: AC
  Administered 2019-10-20: 60 mg via INTRAMUSCULAR

## 2019-10-20 NOTE — ED Provider Notes (Signed)
[facility]   [id number] 10/20/19 Arrival Time: 4128  CC: HEADACHE  SUBJECTIVE:  Emrah Ariola is a 34 y.o. male who complains of migraine for the last 3 days. Denies a precipitating event, or recent head trauma. Patient localizes her pain to the front and left side of the head. Describes the pain as constant and throbbing in character. Patient has tried OTC ibuprofen and tylenol without relief. Symptoms are made worse with light and sound. Reports similar symptoms in the past that improved with OTC meds. Reports that he has had migraines before but that he has not had one that has lasted this long. This is not the worst headache of their life. Patient denies fever, chills, nausea, vomiting, aura, rhinorrhea, watery eyes, chest pain, SOB, abdominal pain, weakness, numbness or tingling, slurred speech.     ROS: As per HPI.  All other pertinent ROS negative.     No past medical history on file. Not on File No current facility-administered medications on file prior to encounter.   No current outpatient medications on file prior to encounter.   Social History   Socioeconomic History  . Marital status: Married    Spouse name: Not on file  . Number of children: Not on file  . Years of education: Not on file  . Highest education level: Not on file  Occupational History  . Not on file  Tobacco Use  . Smoking status: Not on file  Substance and Sexual Activity  . Alcohol use: Not on file  . Drug use: Not on file  . Sexual activity: Not on file  Other Topics Concern  . Not on file  Social History Narrative  . Not on file   Social Determinants of Health   Financial Resource Strain:   . Difficulty of Paying Living Expenses:   Food Insecurity:   . Worried About Charity fundraiser in the Last Year:   . Arboriculturist in the Last Year:   Transportation Needs:   . Film/video editor (Medical):   Marland Kitchen Lack of Transportation (Non-Medical):   Physical Activity:   . Days  of Exercise per Week:   . Minutes of Exercise per Session:   Stress:   . Feeling of Stress :   Social Connections:   . Frequency of Communication with Friends and Family:   . Frequency of Social Gatherings with Friends and Family:   . Attends Religious Services:   . Active Member of Clubs or Organizations:   . Attends Archivist Meetings:   Marland Kitchen Marital Status:   Intimate Partner Violence:   . Fear of Current or Ex-Partner:   . Emotionally Abused:   Marland Kitchen Physically Abused:   . Sexually Abused:    No family history on file.  OBJECTIVE:  Vitals:   10/20/19 1811  BP: (!) 129/91  Pulse: 92  Resp: 17  Temp: 97.9 F (36.6 C)  TempSrc: Oral  SpO2: 98%    General appearance: alert; no distress Eyes: PERRLA; EOMI HENT: normocephalic; atraumatic Neck: supple with FROM Lungs: clear to auscultation bilaterally Heart: regular rate and rhythm.  Radial pulses 2+ symmetrical bilaterally Extremities: no edema; symmetrical with no gross deformities Skin: warm and dry Neurologic: CN 2-12 grossly intact; finger to nose without difficulty; normal gait; strength and sensation intact bilaterally about the upper and lower extremities; negative pronator drift Psychological: alert and cooperative; normal mood and affect   ASSESSMENT & PLAN:  1. Other migraine with status migrainosus,  not intractable     Meds ordered this encounter  Medications  . ketorolac (TORADOL) injection 60 mg  . dexamethasone (DECADRON) injection 10 mg     Migraine  Migraine cocktail given in office (Toradol 60mg , Decadron 10mg ) Rest and drink plenty of fluids Use OTC medications as needed for symptomatic relief Follow up with PCP if symptoms persists Return or go to the ER if you have any new or worsening symptoms such as fever, chills, nausea, vomiting, chest pain, shortness of breath, cough, vision changes, worsening headache despite treatment, slurred speech, facial asymmetry, weakness in arms or  legs.  Reviewed expectations re: course of current medical issues. Questions answered. Outlined signs and symptoms indicating need for more acute intervention. Patient verbalized understanding. After Visit Summary given.    , NP 10/20/19 1820

## 2019-10-20 NOTE — Discharge Instructions (Addendum)
You have received Toradol and Decadron as a migraine cocktail in the office today  I have attached information about migraines with this paperwork  Please follow up with this office or with primary care as needed  Follow up with the ER for the worst headache of your life, and if symptoms are not improving with the injections given here

## 2020-01-14 ENCOUNTER — Ambulatory Visit
Admission: RE | Admit: 2020-01-14 | Discharge: 2020-01-14 | Disposition: A | Discharge status: Home / Self Care | Payer: PRIVATE HEALTH INSURANCE | Source: Ambulatory Visit | Attending: Emergency Medicine | Admitting: Emergency Medicine

## 2020-01-14 ENCOUNTER — Other Ambulatory Visit: Payer: Self-pay

## 2020-01-14 VITALS — BP 147/81 | HR 78 | Temp 98.3°F | Resp 18 | Ht 73.0 in | Wt 198.0 lb

## 2020-01-14 DIAGNOSIS — K029 Dental caries, unspecified: Secondary | ICD-10-CM

## 2020-01-14 DIAGNOSIS — K0889 Other specified disorders of teeth and supporting structures: Secondary | ICD-10-CM | POA: Diagnosis not present

## 2020-01-14 DIAGNOSIS — K047 Periapical abscess without sinus: Secondary | ICD-10-CM | POA: Diagnosis not present

## 2020-01-14 MED ORDER — NAPROXEN 500 MG PO TABS: 500.0000 mg | ORAL_TABLET | Freq: Two times a day (BID) | ORAL | 0 refills | Status: DC

## 2020-01-14 MED ORDER — CHLORHEXIDINE GLUCONATE 0.12 % MT SOLN: 15.0000 mL | Freq: Two times a day (BID) | OROMUCOSAL | 0 refills | Status: DC

## 2020-01-14 MED ORDER — AMOXICILLIN-POT CLAVULANATE 875-125 MG PO TABS: 1.0000 | ORAL_TABLET | Freq: Two times a day (BID) | ORAL | 0 refills | Status: DC

## 2020-01-14 NOTE — ED Provider Notes (Signed)
[facility]   [id number] 01/14/20 Arrival Time: 1023  CC: DENTAL PAIN  SUBJECTIVE:  Bobby Ali is a 34 y.o. male who presented to the urgent care for complaint of dental pain for the past 3 days.  Localizes pain to the left lower gum has tried OTC analgesics without relief.  Worse with chewing.  Does not see a dentist regularly.  Report similar symptoms in the past.  Denies fever, chills, dysphagia, odynophagia, oral or neck swelling, nausea, vomiting, chest pain, SOB.    ROS: As per HPI.  All other pertinent ROS negative.     Past Medical History:  Diagnosis Date  . Asthma   . GERD (gastroesophageal reflux disease)   . Hx of migraine headaches 1998   15 years per patient    Past Surgical History:  Procedure Laterality Date  . ESOPHAGOGASTRODUODENOSCOPY N/A 06/16/2013   Procedure: ESOPHAGOGASTRODUODENOSCOPY (EGD);  Surgeon: Malissa Hippo, MD;  Location: [facility];  Service: Endoscopy;  Laterality: N/A;  250-moved to 1155 Ann to notify pt  . FINGER FRACTURE SURGERY    . PILONIDAL CYST EXCISION     No Known Allergies No current facility-administered medications on file prior to encounter.   Current Outpatient Medications on File Prior to Encounter  Medication Sig Dispense Refill  . clindamycin (CLEOCIN) 300 MG capsule Take 1 capsule (300 mg total) by mouth 4 (four) times daily. 28 capsule 0  . cyclobenzaprine (FLEXERIL) 10 MG tablet Take 1 tablet (10 mg total) by mouth 3 (three) times daily as needed. 21 tablet 0  . dexamethasone (DECADRON) 4 MG tablet Take 1 tablet (4 mg total) by mouth 2 (two) times daily with a meal. 10 tablet 0  . diclofenac (VOLTAREN) 75 MG EC tablet Take 1 tablet (75 mg total) by mouth 2 (two) times daily. Take with food 14 tablet 0  . diltiazem 2 % GEL Apply 1 application topically 3 (three) times daily. 30 g 1  . docusate sodium (COLACE) 100 MG capsule Take 1 capsule (100 mg total) by mouth 2 (two) times daily as needed for mild  constipation. 60 capsule 0  . doxycycline (VIBRAMYCIN) 100 MG capsule Take 1 capsule (100 mg total) by mouth 2 (two) times daily. 14 capsule 0  . HYDROcodone-homatropine (HYCODAN) 5-1.5 MG/5ML syrup Take 5 mLs by mouth every 6 (six) hours as needed. 100 mL 0  . loratadine-pseudoephedrine (CLARITIN-D 12 HOUR) 5-120 MG tablet Take 1 tablet by mouth 2 (two) times daily. 20 tablet 0  . oxyCODONE-acetaminophen (PERCOCET) 5-325 MG tablet Take 1-2 tablets by mouth every 6 (six) hours as needed for severe pain. 10 tablet 0  . predniSONE (DELTASONE) 10 MG tablet Take 6 tablets day one, 5 tablets day two, 4 tablets day three, 3 tablets day four, 2 tablets day five, then 1 tablet day six 21 tablet 0   Social History   Socioeconomic History  . Marital status: Married    Spouse name: Not on file  . Number of children: Not on file  . Years of education: Not on file  . Highest education level: Not on file  Occupational History  . Not on file  Tobacco Use  . Smoking status: Current Every Day Smoker    Packs/day: 1.50    Years: 7.00    Pack years: 10.50    Types: Cigarettes  . Smokeless tobacco: Never Used  . Tobacco comment: x 7 rs.   Vaping Use  . Vaping Use: Never used  Substance  and Sexual Activity  . Alcohol use: Yes    Comment: occasional  . Drug use: No  . Sexual activity: Not on file  Other Topics Concern  . Not on file  Social History Narrative  . Not on file   Social Determinants of Health   Financial Resource Strain:   . Difficulty of Paying Living Expenses: Not on file  Food Insecurity:   . Worried About Programme researcher, broadcasting/film/video in the Last Year: Not on file  . Ran Out of Food in the Last Year: Not on file  Transportation Needs:   . Lack of Transportation (Medical): Not on file  . Lack of Transportation (Non-Medical): Not on file  Physical Activity:   . Days of Exercise per Week: Not on file  . Minutes of Exercise per Session: Not on file  Stress:   . Feeling of Stress : Not  on file  Social Connections:   . Frequency of Communication with Friends and Family: Not on file  . Frequency of Social Gatherings with Friends and Family: Not on file  . Attends Religious Services: Not on file  . Active Member of Clubs or Organizations: Not on file  . Attends Banker Meetings: Not on file  . Marital Status: Not on file  Intimate Partner Violence:   . Fear of Current or Ex-Partner: Not on file  . Emotionally Abused: Not on file  . Physically Abused: Not on file  . Sexually Abused: Not on file   Family History  Problem Relation Age of Onset  . Colon cancer Neg Hx     OBJECTIVE:  Vitals:   01/14/20 1052  BP: (!) 147/81  Pulse: 78  Resp: 18  Temp: 98.3 F (36.8 C)  TempSrc: Oral  SpO2: 98%  Weight: 198 lb (89.8 kg)  Height: 6\' 1"  (1.854 m)    Physical Exam Vitals and nursing note reviewed.  Constitutional:      General: He is not in acute distress.    Appearance: Normal appearance. He is normal weight. He is not ill-appearing, toxic-appearing or diaphoretic.  HENT:     Mouth/Throat:     Lips: Pink.     Mouth: Mucous membranes are moist.     Dentition: Abnormal dentition. Dental caries and dental abscesses present.      Comments: Dental abscess Cardiovascular:     Rate and Rhythm: Normal rate and regular rhythm.     Pulses: Normal pulses.     Heart sounds: Normal heart sounds. No murmur heard.  No friction rub. No gallop.   Pulmonary:     Effort: Pulmonary effort is normal. No respiratory distress.     Breath sounds: Normal breath sounds. No stridor. No wheezing, rhonchi or rales.  Chest:     Chest wall: No tenderness.  Neurological:     Mental Status: He is alert and oriented to person, place, and time.      ASSESSMENT & PLAN:  1. Pain, dental   2. Abscess, dental   3. Dental caries     Meds ordered this encounter  Medications  . chlorhexidine (PERIDEX) 0.12 % solution    Sig: Use as directed 15 mLs in the mouth or  throat 2 (two) times daily.    Dispense:  120 mL    Refill:  0  . amoxicillin-clavulanate (AUGMENTIN) 875-125 MG tablet    Sig: Take 1 tablet by mouth every 12 (twelve) hours.    Dispense:  14 tablet  Refill:  0  . naproxen (NAPROSYN) 500 MG tablet    Sig: Take 1 tablet (500 mg total) by mouth 2 (two) times daily.    Dispense:  30 tablet    Refill:  0    Discharge instructions  Naproxen prescribed.  Use as directed for pain relief Chlorhexidine and Augmentin were prescribed Recommend soft diet until evaluated by dentist Maintain oral hygiene care Follow up with dentist as soon as possible for further evaluation and treatment  Return or go to the ED if you have any new or worsening symptoms such as fever, chills, difficulty swallowing, painful swallowing, oral or neck swelling, nausea, vomiting, chest pain, SOB, etc...  Reviewed expectations re: course of current medical issues. Questions answered. Outlined signs and symptoms indicating need for more acute intervention. Patient verbalized understanding. After Visit Summary given.   Durward Parcel, FNP 01/14/20 1124

## 2020-01-14 NOTE — Discharge Instructions (Addendum)
Naproxen prescribed.  Use as directed for pain relief Chlorhexidine and Augmentin were prescribed Recommend soft diet until evaluated by dentist Maintain oral hygiene care Follow up with dentist as soon as possible for further evaluation and treatment  Return or go to the ED if you have any new or worsening symptoms such as fever, chills, difficulty swallowing, painful swallowing, oral or neck swelling, nausea, vomiting, chest pain, SOB, etc... 

## 2020-01-14 NOTE — ED Triage Notes (Signed)
Dental pain to lower LT side x 3 days

## 2020-02-01 ENCOUNTER — Ambulatory Visit
Admission: RE | Admit: 2020-02-01 | Discharge: 2020-02-01 | Disposition: A | Discharge status: Home / Self Care | Payer: PRIVATE HEALTH INSURANCE | Source: Ambulatory Visit | Attending: Emergency Medicine | Admitting: Emergency Medicine

## 2020-02-01 ENCOUNTER — Other Ambulatory Visit: Payer: Self-pay

## 2020-02-01 VITALS — BP 128/85 | HR 90 | Temp 98.3°F | Resp 16

## 2020-02-01 DIAGNOSIS — J069 Acute upper respiratory infection, unspecified: Secondary | ICD-10-CM

## 2020-02-01 MED ORDER — CETIRIZINE HCL 10 MG PO TABS: 10.0000 mg | ORAL_TABLET | Freq: Every day | ORAL | 0 refills | Status: DC

## 2020-02-01 MED ORDER — BENZONATATE 100 MG PO CAPS: 100.0000 mg | ORAL_CAPSULE | Freq: Three times a day (TID) | ORAL | 0 refills | Status: DC

## 2020-02-01 MED ORDER — DEXAMETHASONE 4 MG PO TABS: 4.0000 mg | ORAL_TABLET | Freq: Every day | ORAL | 0 refills | Status: AC

## 2020-02-01 MED ORDER — FLUTICASONE PROPIONATE 50 MCG/ACT NA SUSP: 1.0000 | Freq: Every day | NASAL | 0 refills | Status: DC

## 2020-02-01 NOTE — ED Triage Notes (Signed)
Pt states he tested positive for covid on 9/19 and has returned to work, developed nasal congestion and cough

## 2020-02-01 NOTE — Discharge Instructions (Addendum)
You should remain isolated in your home for 10 days from symptom onset AND greater than 24 hours after symptoms resolution (absence of fever without the use of fever-reducing medication and improvement in respiratory symptoms), whichever is longer Get plenty of rest and push fluids Tessalon Perles prescribed for cough Zyrtec-D prescribed for nasal congestion, runny nose, and/or sore throat Flonase prescribed for nasal congestion and runny nose Decadron was prescribed Use medications daily for symptom relief Use OTC medications like ibuprofen or tylenol as needed fever or pain Call or go to the ED if you have any new or worsening symptoms such as fever, worsening cough, shortness of breath, chest tightness, chest pain, turning blue, changes in mental status, etc..Marland Kitchen

## 2020-02-01 NOTE — ED Provider Notes (Signed)
[facility]   [id number] 02/01/20 Arrival Time: 1459   CC: URI  SUBJECTIVE: History from: patient.  Bobby Ali is a 34 y.o. male presents the urgent care for complaint of cough and nasal congestion for the past few days.  States he tested positive for Covid on 01/22/2020 has returned to work and now has a new  symptom of cough and nasal congestion.  Denies sick exposure to COVID, flu or strep.  Denies recent travel.  Has tried OTC medication without relief.  Denies aggravating factors.  Report previous symptoms in the past.   Denies fever, chills, fatigue, sinus pain, rhinorrhea, sore throat, SOB, wheezing, chest pain, nausea, changes in bowel or bladder habits.     ROS: As per HPI.  All other pertinent ROS negative.     Past Medical History:  Diagnosis Date   Asthma    GERD (gastroesophageal reflux disease)    Hx of migraine headaches 1998   15 years per patient    Past Surgical History:  Procedure Laterality Date   ESOPHAGOGASTRODUODENOSCOPY N/A 06/16/2013   Procedure: ESOPHAGOGASTRODUODENOSCOPY (EGD);  Surgeon: Malissa Hippo, MD;  Location: [facility];  Service: Endoscopy;  Laterality: N/A;  250-moved to 1155 Ann to notify pt   FINGER FRACTURE SURGERY     PILONIDAL CYST EXCISION     No Known Allergies No current facility-administered medications on file prior to encounter.   Current Outpatient Medications on File Prior to Encounter  Medication Sig Dispense Refill   amoxicillin-clavulanate (AUGMENTIN) 875-125 MG tablet Take 1 tablet by mouth every 12 (twelve) hours. 14 tablet 0   chlorhexidine (PERIDEX) 0.12 % solution Use as directed 15 mLs in the mouth or throat 2 (two) times daily. 120 mL 0   clindamycin (CLEOCIN) 300 MG capsule Take 1 capsule (300 mg total) by mouth 4 (four) times daily. 28 capsule 0   cyclobenzaprine (FLEXERIL) 10 MG tablet Take 1 tablet (10 mg total) by mouth 3 (three) times daily as needed. 21 tablet 0   diclofenac  (VOLTAREN) 75 MG EC tablet Take 1 tablet (75 mg total) by mouth 2 (two) times daily. Take with food 14 tablet 0   diltiazem 2 % GEL Apply 1 application topically 3 (three) times daily. 30 g 1   docusate sodium (COLACE) 100 MG capsule Take 1 capsule (100 mg total) by mouth 2 (two) times daily as needed for mild constipation. 60 capsule 0   doxycycline (VIBRAMYCIN) 100 MG capsule Take 1 capsule (100 mg total) by mouth 2 (two) times daily. 14 capsule 0   HYDROcodone-homatropine (HYCODAN) 5-1.5 MG/5ML syrup Take 5 mLs by mouth every 6 (six) hours as needed. 100 mL 0   loratadine-pseudoephedrine (CLARITIN-D 12 HOUR) 5-120 MG tablet Take 1 tablet by mouth 2 (two) times daily. 20 tablet 0   naproxen (NAPROSYN) 500 MG tablet Take 1 tablet (500 mg total) by mouth 2 (two) times daily. 30 tablet 0   oxyCODONE-acetaminophen (PERCOCET) 5-325 MG tablet Take 1-2 tablets by mouth every 6 (six) hours as needed for severe pain. 10 tablet 0   predniSONE (DELTASONE) 10 MG tablet Take 6 tablets day one, 5 tablets day two, 4 tablets day three, 3 tablets day four, 2 tablets day five, then 1 tablet day six 21 tablet 0   Social History   Socioeconomic History   Marital status: Married    Spouse name: Not on file   Number of children: Not on file   Years of education: Not  on file   Highest education level: Not on file  Occupational History   Not on file  Tobacco Use   Smoking status: Current Every Day Smoker    Packs/day: 1.50    Years: 7.00    Pack years: 10.50    Types: Cigarettes   Smokeless tobacco: Never Used   Tobacco comment: x 7 rs.   Vaping Use   Vaping Use: Never used  Substance and Sexual Activity   Alcohol use: Yes    Comment: occasional   Drug use: No   Sexual activity: Not on file  Other Topics Concern   Not on file  Social History Narrative   Not on file   Social Determinants of Health   Financial Resource Strain:    Difficulty of Paying Living Expenses: Not on  file  Food Insecurity:    Worried About Running Out of Food in the Last Year: Not on file   Ran Out of Food in the Last Year: Not on file  Transportation Needs:    Lack of Transportation (Medical): Not on file   Lack of Transportation (Non-Medical): Not on file  Physical Activity:    Days of Exercise per Week: Not on file   Minutes of Exercise per Session: Not on file  Stress:    Feeling of Stress : Not on file  Social Connections:    Frequency of Communication with Friends and Family: Not on file   Frequency of Social Gatherings with Friends and Family: Not on file   Attends Religious Services: Not on file   Active Member of Clubs or Organizations: Not on file   Attends Banker Meetings: Not on file   Marital Status: Not on file  Intimate Partner Violence:    Fear of Current or Ex-Partner: Not on file   Emotionally Abused: Not on file   Physically Abused: Not on file   Sexually Abused: Not on file   Family History  Problem Relation Age of Onset   Colon cancer Neg Hx     OBJECTIVE:  Vitals:   02/01/20 1513  BP: 128/85  Pulse: 90  Resp: 16  Temp: 98.3 F (36.8 C)  SpO2: 98%     General appearance: alert; appears fatigued, but nontoxic; speaking in full sentences and tolerating own secretions HEENT: NCAT; Ears: EACs clear, TMs pearly gray; Eyes: PERRL.  EOM grossly intact. Sinuses: nontender; Nose: nares patent without rhinorrhea, Throat: oropharynx clear, tonsils non erythematous or enlarged, uvula midline  Neck: supple without LAD Lungs: unlabored respirations, symmetrical air entry; cough: moderate; no respiratory distress; CTAB Heart: regular rate and rhythm.  Radial pulses 2+ symmetrical bilaterally Skin: warm and dry Psychological: alert and cooperative; normal mood and affect  LABS:  No results found for this or any previous visit (from the past 24 hour(s)).   ASSESSMENT & PLAN:  1. URI with cough and congestion     Meds  ordered this encounter  Medications   fluticasone (FLONASE) 50 MCG/ACT nasal spray    Sig: Place 1 spray into both nostrils daily for 14 days.    Dispense:  16 g    Refill:  0   cetirizine (ZYRTEC ALLERGY) 10 MG tablet    Sig: Take 1 tablet (10 mg total) by mouth daily.    Dispense:  30 tablet    Refill:  0   benzonatate (TESSALON) 100 MG capsule    Sig: Take 1 capsule (100 mg total) by mouth every 8 (eight) hours.  Dispense:  30 capsule    Refill:  0   dexamethasone (DECADRON) 4 MG tablet    Sig: Take 1 tablet (4 mg total) by mouth daily for 7 days.    Dispense:  7 tablet    Refill:  0     You should remain isolated in your home for 10 days from symptom onset AND greater than 24 hours after symptoms resolution (absence of fever without the use of fever-reducing medication and improvement in respiratory symptoms), whichever is longer Get plenty of rest and push fluids Tessalon Perles prescribed for cough Zyrtec-D prescribed for nasal congestion, runny nose, and/or sore throat Flonase prescribed for nasal congestion and runny nose Decadron was prescribed Use medications daily for symptom relief Use OTC medications like ibuprofen or tylenol as needed fever or pain Call or go to the ED if you have any new or worsening symptoms such as fever, worsening cough, shortness of breath, chest tightness, chest pain, turning blue, changes in mental status, etc...  Reviewed expectations re: course of current medical issues. Questions answered. Outlined signs and symptoms indicating need for more acute intervention. Patient verbalized understanding. After Visit Summary given.         Durward Parcel, FNP 02/01/20 1529

## 2021-04-06 ENCOUNTER — Other Ambulatory Visit: Payer: Self-pay

## 2021-04-06 ENCOUNTER — Encounter (HOSPITAL_COMMUNITY): Payer: Self-pay

## 2021-04-06 ENCOUNTER — Emergency Department (HOSPITAL_COMMUNITY): Payer: BLUE CROSS/BLUE SHIELD

## 2021-04-06 ENCOUNTER — Emergency Department (HOSPITAL_COMMUNITY)
Admission: EM | Admit: 2021-04-06 | Discharge: 2021-04-06 | Disposition: A | Discharge status: Home / Self Care | Payer: BLUE CROSS/BLUE SHIELD | Attending: Emergency Medicine | Admitting: Emergency Medicine

## 2021-04-06 DIAGNOSIS — R600 Localized edema: Secondary | ICD-10-CM | POA: Diagnosis present

## 2021-04-06 DIAGNOSIS — F17210 Nicotine dependence, cigarettes, uncomplicated: Secondary | ICD-10-CM | POA: Insufficient documentation

## 2021-04-06 DIAGNOSIS — J45909 Unspecified asthma, uncomplicated: Secondary | ICD-10-CM | POA: Insufficient documentation

## 2021-04-06 DIAGNOSIS — K611 Rectal abscess: Secondary | ICD-10-CM | POA: Diagnosis not present

## 2021-04-06 LAB — BASIC METABOLIC PANEL
Anion gap: 7 (ref 5–15)
BUN: 13 mg/dL (ref 6–20)
CO2: 25 mmol/L (ref 22–32)
Calcium: 8.9 mg/dL (ref 8.9–10.3)
Chloride: 106 mmol/L (ref 98–111)
Creatinine, Ser: 0.81 mg/dL (ref 0.61–1.24)
GFR, Estimated: >60 mL/min (ref >60)
Glucose, Bld: 97 mg/dL (ref 70–99)
Potassium: 4.1 mmol/L (ref 3.5–5.1)
Sodium: 138 mmol/L (ref 135–145)

## 2021-04-06 LAB — CBC WITH DIFFERENTIAL/PLATELET
Abs Immature Granulocytes: 0.04 10*3/uL (ref 0.00–0.07)
Basophils Absolute: 0 10*3/uL (ref 0.0–0.1)
Basophils Relative: 0 %
Eosinophils Absolute: 0.2 10*3/uL (ref 0.0–0.5)
Eosinophils Relative: 2 %
HCT: 46.5 % (ref 39.0–52.0)
Hemoglobin: 15.5 g/dL (ref 13.0–17.0)
Immature Granulocytes: 0 %
Lymphocytes Relative: 16 %
Lymphs Abs: 1.4 10*3/uL (ref 0.7–4.0)
MCH: 30 pg (ref 26.0–34.0)
MCHC: 33.3 g/dL (ref 30.0–36.0)
MCV: 90.1 fL (ref 80.0–100.0)
Monocytes Absolute: 0.5 10*3/uL (ref 0.1–1.0)
Monocytes Relative: 6 %
Neutro Abs: 7 10*3/uL (ref 1.7–7.7)
Neutrophils Relative %: 76 %
Platelets: 231 10*3/uL (ref 150–400)
RBC: 5.16 MIL/uL (ref 4.22–5.81)
RDW: 14.3 % (ref 11.5–15.5)
WBC: 9.2 10*3/uL (ref 4.0–10.5)
nRBC: 0 % (ref 0.0–0.2)

## 2021-04-06 MED ORDER — HYDROCODONE-ACETAMINOPHEN 5-325 MG PO TABS
ORAL_TABLET | ORAL | 0 refills | Status: DC
Start: 2021-04-06 — End: 2021-10-09

## 2021-04-06 MED ORDER — IOHEXOL 300 MG/ML  SOLN
100.0000 mL | Freq: Once | INTRAMUSCULAR | Status: AC | PRN
  Administered 2021-04-06: 100 mL via INTRAVENOUS

## 2021-04-06 MED ORDER — AMOXICILLIN-POT CLAVULANATE 875-125 MG PO TABS: 1.0000 | ORAL_TABLET | Freq: Two times a day (BID) | ORAL | 0 refills | Status: DC

## 2021-04-06 MED ORDER — HYDROCODONE-ACETAMINOPHEN 5-325 MG PO TABS
1.0000 | ORAL_TABLET | Freq: Once | ORAL | Status: AC
  Administered 2021-04-06: 1 via ORAL
  Filled 2021-04-06: qty 1

## 2021-04-06 MED ORDER — AMOXICILLIN-POT CLAVULANATE 875-125 MG PO TABS
1.0000 | ORAL_TABLET | Freq: Once | ORAL | Status: AC
  Administered 2021-04-06: 1 via ORAL
  Filled 2021-04-06: qty 1

## 2021-04-06 NOTE — Discharge Instructions (Signed)
Your CT today shows that you have a developing abscess.  It is important that you continue the warm soaks 2-3 times a day.  Take the antibiotic as directed until its finished.  Call Dr. Lovell Sheehan office on Monday to arrange a follow-up appointment for Tuesday.  Return to the emergency department for any new or worsening symptoms.

## 2021-04-06 NOTE — ED Provider Notes (Signed)
[facility] EMERGENCY DEPARTMENT Provider Note   CSN: [id number] Arrival date & time: 04/06/21  1006     History Chief Complaint  Patient presents with   Abscess    Bobby Ali is a 35 y.o. male.   Abscess Associated symptoms: no fever, no headaches, no nausea and no vomiting        Bobby Ali is a 35 y.o. male who presents to the Emergency Department complaining of pain and swelling to his perineum x1 day.  States he had surgery for a perirectal abscess back in September of this year in [place].  States current symptoms feel similar.  He noticed pain to palpation of the area and with sitting.  He denies any drainage or bleeding to the area.  No pain associated with urination or defecation.  He denies any abdominal pain fever or chills.  No nausea or vomiting.   Past Medical History:  Diagnosis Date   Asthma    GERD (gastroesophageal reflux disease)    Hx of migraine headaches 1998   15 years per patient     Patient Active Problem List   Diagnosis Date Noted   GERD (gastroesophageal reflux disease) 06/06/2013   Pain in joint, shoulder region 08/02/2012   Muscle weakness (generalized) 08/02/2012    Past Surgical History:  Procedure Laterality Date   ESOPHAGOGASTRODUODENOSCOPY N/A 06/16/2013   Procedure: ESOPHAGOGASTRODUODENOSCOPY (EGD);  Surgeon: Malissa Hippo, MD;  Location: [facility];  Service: Endoscopy;  Laterality: N/A;  250-moved to 1155 Ann to notify pt   FINGER FRACTURE SURGERY     PILONIDAL CYST EXCISION         Family History  Problem Relation Age of Onset   Colon cancer Neg Hx     Social History   Tobacco Use   Smoking status: Every Day    Packs/day: 1.50    Years: 7.00    Pack years: 10.50    Types: Cigarettes   Smokeless tobacco: Never   Tobacco comments:    x 7 rs.   Vaping Use   Vaping Use: Never used  Substance Use Topics   Alcohol use: Yes    Comment: occasional   Drug use: No    Home Medications Prior to Admission  medications   Medication Sig Start Date End Date Taking? Authorizing Provider  amoxicillin-clavulanate (AUGMENTIN) 875-125 MG tablet Take 1 tablet by mouth every 12 (twelve) hours. 01/14/20   Avegno, Zachery Dakins, FNP  benzonatate (TESSALON) 100 MG capsule Take 1 capsule (100 mg total) by mouth every 8 (eight) hours. 02/01/20   Avegno, Zachery Dakins, FNP  cetirizine (ZYRTEC ALLERGY) 10 MG tablet Take 1 tablet (10 mg total) by mouth daily. 02/01/20   Avegno, Zachery Dakins, FNP  chlorhexidine (PERIDEX) 0.12 % solution Use as directed 15 mLs in the mouth or throat 2 (two) times daily. 01/14/20   Avegno, Zachery Dakins, FNP  clindamycin (CLEOCIN) 300 MG capsule Take 1 capsule (300 mg total) by mouth 4 (four) times daily. 10/14/17   Benjerman Molinelli, PA-C  cyclobenzaprine (FLEXERIL) 10 MG tablet Take 1 tablet (10 mg total) by mouth 3 (three) times daily as needed. 09/04/17   Yola Paradiso, PA-C  diclofenac (VOLTAREN) 75 MG EC tablet Take 1 tablet (75 mg total) by mouth 2 (two) times daily. Take with food 10/14/17   Natalie Leclaire, PA-C  diltiazem 2 % GEL Apply 1 application topically 3 (three) times daily. 05/14/17   Raeford Razor, MD  docusate sodium (COLACE) 100  MG capsule Take 1 capsule (100 mg total) by mouth 2 (two) times daily as needed for mild constipation. 05/14/17   Raeford Razor, MD  doxycycline (VIBRAMYCIN) 100 MG capsule Take 1 capsule (100 mg total) by mouth 2 (two) times daily. 12/26/17   Ivery Quale, PA-C  fluticasone (FLONASE) 50 MCG/ACT nasal spray Place 1 spray into both nostrils daily for 14 days. 02/01/20 02/15/20  Avegno, Zachery Dakins, FNP  HYDROcodone-homatropine (HYCODAN) 5-1.5 MG/5ML syrup Take 5 mLs by mouth every 6 (six) hours as needed. 12/26/17   Ivery Quale, PA-C  loratadine-pseudoephedrine (CLARITIN-D 12 HOUR) 5-120 MG tablet Take 1 tablet by mouth 2 (two) times daily. 12/26/17   Ivery Quale, PA-C  naproxen (NAPROSYN) 500 MG tablet Take 1 tablet (500 mg total) by mouth 2 (two) times daily.  01/14/20   Avegno, Zachery Dakins, FNP  oxyCODONE-acetaminophen (PERCOCET) 5-325 MG tablet Take 1-2 tablets by mouth every 6 (six) hours as needed for severe pain. 11/29/15   Arthor Captain, PA-C  predniSONE (DELTASONE) 10 MG tablet Take 6 tablets day one, 5 tablets day two, 4 tablets day three, 3 tablets day four, 2 tablets day five, then 1 tablet day six 09/04/17   Dorothee Napierkowski, PA-C    Allergies    Patient has no known allergies.  Review of Systems   Review of Systems  Constitutional:  Negative for chills and fever.  Respiratory:  Negative for shortness of breath.   Cardiovascular:  Negative for chest pain.  Gastrointestinal:  Negative for abdominal pain, anal bleeding, diarrhea, nausea and vomiting.  Genitourinary:  Negative for difficulty urinating, dysuria, penile pain, penile swelling, scrotal swelling and testicular pain.       Pain and swelling of his perineum  Musculoskeletal:  Negative for arthralgias and joint swelling.  Skin:  Negative for color change.  Neurological:  Negative for dizziness, weakness and headaches.  Hematological:  Negative for adenopathy.  All other systems reviewed and are negative.  Physical Exam Updated Vital Signs BP 111/69   Pulse 67   Temp 98.1 F (36.7 C) (Oral)   Resp 16   Ht 6\' 1"  (1.854 m)   Wt 81.6 kg   SpO2 100%   BMI 23.75 kg/m   Physical Exam Vitals and nursing note reviewed. Exam conducted with a chaperone present.  Constitutional:      Appearance: Normal appearance. He is not ill-appearing or toxic-appearing.  Cardiovascular:     Rate and Rhythm: Normal rate and regular rhythm.     Pulses: Normal pulses.  Pulmonary:     Effort: Pulmonary effort is normal.     Breath sounds: Normal breath sounds. No wheezing.  Abdominal:     Palpations: Abdomen is soft.     Tenderness: There is no abdominal tenderness. There is no guarding or rebound.  Genitourinary:    Testes: Normal.     Comments: Chaperoned by nursing staff during exam.   Patient has some tenderness to the perineum without definite fluctuance or edema.  No rectal tenderness or scrotal tenderness on exam. Musculoskeletal:        General: Normal range of motion.  Skin:    General: Skin is warm and dry.     Capillary Refill: Capillary refill takes less than 2 seconds.     Findings: No rash.  Neurological:     Mental Status: He is alert and oriented to person, place, and time.     Sensory: No sensory deficit.     Motor: No weakness.  ED Results / Procedures / Treatments   Labs (all labs ordered are listed, but only abnormal results are displayed) Labs Reviewed  CBC WITH DIFFERENTIAL/PLATELET  BASIC METABOLIC PANEL    EKG None  Radiology CT PELVIS W CONTRAST  Result Date: 04/06/2021 CLINICAL DATA:  Suspected perianal abscess. EXAM: CT PELVIS WITH CONTRAST TECHNIQUE: Multidetector CT imaging of the pelvis was performed using the standard protocol following the bolus administration of intravenous contrast. CONTRAST:  OMNIPAQUE IOHEXOL 300 MG/ML  SOLN COMPARISON:  January 13, 2021 FINDINGS: Urinary Tract:  No abnormality visualized. Bowel:  Unremarkable visualized pelvic bowel loops. Vascular/Lymphatic: No pathologically enlarged lymph nodes. No significant vascular abnormality seen. Reproductive:  No mass or other significant abnormality Other: 2.6 x 1.5 cm rim enhancing collection in the medial left buttock situated between the anus and scrotum. Mild surrounding inflammatory changes. No clear connection to the rectum or scrotum is seen. Musculoskeletal: No suspicious bone lesions identified. IMPRESSION: 2.6 x 1.5 cm recurrent perianal abscess. No clear connection to the rectum or scrotum is seen. Electronically Signed   By: Ted Mcalpine M.D.   On: 04/06/2021 13:07    Procedures Procedures   Medications Ordered in ED Medications - No data to display  ED Course  I have reviewed the triage vital signs and the nursing notes.  Pertinent  labs & imaging results that were available during my care of the patient were reviewed by me and considered in my medical decision making (see chart for details).    MDM Rules/Calculators/A&P                           Patient here with symptoms suggestive of perineal abscess.  History of same that was surgically treated 3 months ago in [place], [place].  States symptoms began yesterday and feels similar to previous episode.  On exam, patient well-appearing nontoxic.  No abdominal, scrotal or rectal tenderness on exam.  No edema.  Vital signs reassuring.  Likely developing abscess, equivocal exam without definite abscess. will obtain labs and CT for further evaluation.  Consulted findings with general surgery, Dr. Lovell Sheehan who recommends antibiotics and he will see in clinic on Tuesday.  Patient agreeable to plan.  Continue warm soaks.  Return precautions were discussed.   Final Clinical Impression(s) / ED Diagnoses Final diagnoses:  Perirectal abscess    Rx / DC Orders ED Discharge Orders     None        Pauline Aus, PA-C 04/06/21 1401    Benjiman Core, MD 04/06/21 [phone]

## 2021-04-06 NOTE — ED Triage Notes (Signed)
Had surgery in sept on abscess between rectum and scrotum and it started swelling and hurting again yesterday.

## 2021-04-09 ENCOUNTER — Ambulatory Visit: Payer: BLUE CROSS/BLUE SHIELD | Admitting: General Surgery

## 2021-04-16 ENCOUNTER — Ambulatory Visit: Payer: BLUE CROSS/BLUE SHIELD | Admitting: General Surgery

## 2021-10-09 ENCOUNTER — Other Ambulatory Visit: Payer: Self-pay

## 2021-10-09 ENCOUNTER — Encounter (HOSPITAL_COMMUNITY): Payer: Self-pay

## 2021-10-09 ENCOUNTER — Emergency Department (HOSPITAL_COMMUNITY)
Admission: EM | Admit: 2021-10-09 | Discharge: 2021-10-09 | Disposition: A | Discharge status: Home / Self Care | Payer: BLUE CROSS/BLUE SHIELD | Attending: Emergency Medicine | Admitting: Emergency Medicine

## 2021-10-09 DIAGNOSIS — K611 Rectal abscess: Secondary | ICD-10-CM | POA: Diagnosis not present

## 2021-10-09 DIAGNOSIS — R102 Pelvic and perineal pain: Secondary | ICD-10-CM | POA: Diagnosis present

## 2021-10-09 MED ORDER — AMOXICILLIN-POT CLAVULANATE 875-125 MG PO TABS: 1.0000 | ORAL_TABLET | Freq: Two times a day (BID) | ORAL | 0 refills | Status: AC

## 2021-10-09 MED ORDER — POVIDONE-IODINE 10 % EX SOLN
CUTANEOUS | Status: DC | PRN
  Filled 2021-10-09 (×2): qty 14.8

## 2021-10-09 MED ORDER — DOXYCYCLINE HYCLATE 100 MG PO TABS: 100.0000 mg | ORAL_TABLET | Freq: Once | ORAL | Status: DC

## 2021-10-09 MED ORDER — HYDROCODONE-ACETAMINOPHEN 5-325 MG PO TABS
ORAL_TABLET | ORAL | 0 refills | Status: AC
Start: 2021-10-09 — End: ?

## 2021-10-09 MED ORDER — AMOXICILLIN-POT CLAVULANATE 875-125 MG PO TABS
1.0000 | ORAL_TABLET | Freq: Once | ORAL | Status: AC
  Administered 2021-10-09: 1 via ORAL
  Filled 2021-10-09: qty 1

## 2021-10-09 MED ORDER — LIDOCAINE-EPINEPHRINE (PF) 2 %-1:200000 IJ SOLN
20.0000 mL | Freq: Once | INTRAMUSCULAR | Status: AC
  Administered 2021-10-09: 20 mL via INTRADERMAL
  Filled 2021-10-09: qty 20

## 2021-10-09 NOTE — ED Notes (Signed)
Entered room, provider at bedside performing I&D to pt. No s/s of acute distress. At this time pt is on left side, tolerating procedure well

## 2021-10-09 NOTE — Discharge Instructions (Signed)
Take the antibiotics as directed.  Continue your warm water soaks 2-3 times a day.  Follow-up with the surgeon listed if your symptoms or not improving.  Return to the emergency department if you develop any worsening symptoms such as increasing pain, vomiting, fever or chills.

## 2021-10-09 NOTE — ED Provider Notes (Signed)
[facility] EMERGENCY DEPARTMENT Provider Note   CSN: [id number] Arrival date & time: 10/09/21  0815     History  Chief Complaint  Patient presents with   Abscess    Bobby Ali is a 36 y.o. male.   Abscess Associated symptoms: no fever, no nausea and no vomiting        Bobby Ali is a 36 y.o. male who presents to the Emergency Department complaining of pain and swelling of the perineum.  Symptoms began last evening.  He endorses prior perirectal abscess that was surgically drained in September 2022.  He notes having recurrent swelling and pain of this area since his surgery, but states it typically goes away after warm water soaks.  He tried soaking in warm water 3 times yesterday without relief.  Pain worse today.  He denies any fever, chills, abdominal pain, difficulty urinating or defecating.  Home Medications Prior to Admission medications   Not on File      Allergies    Patient has no known allergies.    Review of Systems   Review of Systems  Constitutional:  Negative for appetite change, chills and fever.  Gastrointestinal:  Negative for abdominal pain, nausea and vomiting.  Genitourinary:  Negative for difficulty urinating, dysuria, penile swelling and scrotal swelling.       Pain and swelling to the perineum  Musculoskeletal:  Negative for back pain.  Neurological:  Negative for weakness and numbness.   Physical Exam Updated Vital Signs BP 133/77 (BP Location: Right Arm)   Pulse 70   Temp 98 F (36.7 C) (Oral)   Resp 20   Ht 6\' 1"  (1.854 m)   Wt 81.6 kg   SpO2 100%   BMI 23.75 kg/m  Physical Exam Vitals and nursing note reviewed.  Constitutional:      Appearance: Normal appearance. He is not ill-appearing or toxic-appearing.  Cardiovascular:     Rate and Rhythm: Normal rate and regular rhythm.     Pulses: Normal pulses.  Pulmonary:     Effort: Pulmonary effort is normal.  Abdominal:     General: There is no distension.     Palpations:  Abdomen is soft.     Tenderness: There is no abdominal tenderness.  Genitourinary:    Comments: 2-1/2 cm area of induration and tenderness to the perineum.  No fluctuance, drainage or erythema noted.  No tenderness or swelling at the anus.  Scrotum nontender Musculoskeletal:        General: Normal range of motion.  Skin:    General: Skin is warm.     Capillary Refill: Capillary refill takes less than 2 seconds.  Neurological:     General: No focal deficit present.     Mental Status: He is alert.     Sensory: No sensory deficit.     Motor: No weakness.    ED Results / Procedures / Treatments   Labs (all labs ordered are listed, but only abnormal results are displayed) Labs Reviewed - No data to display  EKG None  Radiology No results found.  Procedures Procedures    INCISION AND DRAINAGE Performed by: Jaeleah Smyser Consent: Verbal consent obtained. Risks and benefits: risks, benefits and alternatives were discussed Type: abscess  Body area: Perineum  Anesthesia: local infiltration    Local anesthetic: lidocaine 2% with epinephrine  Anesthetic total: 3 ml  Needle aspiration attempted using an 18-gauge needle.  Purulent material aspirated    Patient tolerance: Patient tolerated the procedure  well with no immediate complications.   Medications Ordered in ED Medications  lidocaine-EPINEPHrine (XYLOCAINE W/EPI) 2 %-1:100000 (with pres) injection 20 mL (has no administration in time range)  povidone-iodine (BETADINE) 10 % external solution (has no administration in time range)    ED Course/ Medical Decision Making/ A&P                           Medical Decision Making Risk OTC drugs. Prescription drug management.   Patient here for evaluation of pain and swelling of his perineum.  Has history of perirectal abscess surgically drained October 2022.  He notes having reoccurring pain and swelling to the area that typically spontaneously resolves.  On exam  today, patient has tenderness and some induration of the area without fluctuance or obvious drainage.  I have attempted needle aspiration that was unsuccessful.  No incision was made.  No involvement of the anus or scrotum on exam.  Patient agreeable to antibiotics and close follow-up with general surgery.  He is requesting local surgery follow-up.  He appears appropriate for discharge home.  Return precautions were discussed.        Final Clinical Impression(s) / ED Diagnoses Final diagnoses:  Perirectal abscess    Rx / DC Orders ED Discharge Orders     None         Kem Parkinson, PA-C 10/12/21 1345    Sherwood Gambler, MD 10/14/21 1722

## 2021-10-09 NOTE — ED Notes (Signed)
Patient stated that he did not want a dressing placed on wound. Patient educated on the reasoning behind wearing a dressing and infection prevention. Patient stated he would put a dressing on wound when he got home. Patient discharged with supplies to dress wound.

## 2021-10-09 NOTE — ED Triage Notes (Signed)
Patient with complaints of abscess under scrotum that started the previous day.

## 2022-03-15 ENCOUNTER — Encounter (INDEPENDENT_AMBULATORY_CARE_PROVIDER_SITE_OTHER): Payer: Self-pay | Admitting: Gastroenterology

## 2022-10-19 ENCOUNTER — Other Ambulatory Visit: Payer: Self-pay

## 2022-10-19 ENCOUNTER — Emergency Department (HOSPITAL_COMMUNITY)
Admission: EM | Admit: 2022-10-19 | Discharge: 2022-10-19 | Disposition: A | Discharge status: Home / Self Care | Payer: BC Managed Care – PPO | Attending: Emergency Medicine | Admitting: Emergency Medicine

## 2022-10-19 DIAGNOSIS — H9209 Otalgia, unspecified ear: Secondary | ICD-10-CM | POA: Diagnosis present

## 2022-10-19 DIAGNOSIS — F17210 Nicotine dependence, cigarettes, uncomplicated: Secondary | ICD-10-CM | POA: Diagnosis not present

## 2022-10-19 DIAGNOSIS — H6123 Impacted cerumen, bilateral: Secondary | ICD-10-CM

## 2022-10-19 MED ORDER — CARBAMIDE PEROXIDE 6.5 % OT SOLN
5.0000 [drp] | Freq: Once | OTIC | Status: AC
  Administered 2022-10-19: 5 [drp] via OTIC
  Filled 2022-10-19: qty 15

## 2022-10-19 MED ORDER — DOCUSATE SODIUM 100 MG PO CAPS: ORAL_CAPSULE | ORAL | 0 refills | Status: AC

## 2022-10-19 NOTE — ED Provider Notes (Signed)
[place] EMERGENCY DEPARTMENT AT [facility] Provider Note   CSN: [id number] Arrival date & time: 10/19/22  0865     History  Chief Complaint  Patient presents with   Otalgia    [name] is a 37 y.o. male.  Pt is a 37 yo male with pmhx significant for migraines and gerd.  He has been feeling like his ears have been stopped up for the past few days.  He's had some ringing.  No sinus congestion.  No fever.  No cough.  He is exposed to loud noise at work, but wears [employer] copy[employer].  He does smoke cigarettes.       Home Medications Prior to Admission medications   Medication Sig Start Date End Date Taking? Authorizing Provider  docusate sodium (COLACE) 100 MG capsule Cut off the end of capsule and apply to ears as needed for cerumen impaction. 10/19/22  Yes Jacalyn Lefevre, MD  amoxicillin-clavulanate (AUGMENTIN) 875-125 MG tablet Take 1 tablet by mouth every 12 (twelve) hours. 10/09/21   Triplett, Tammy, PA-C  HYDROcodone-acetaminophen (NORCO/VICODIN) 5-325 MG tablet Take one tab po q 4 hrs prn pain 10/09/21   Triplett, Tammy, PA-C      Allergies    Patient has no known allergies.    Review of Systems   Review of Systems  HENT:         Ears feel clogged  All other systems reviewed and are negative.   Physical Exam Updated Vital Signs BP 116/82   Pulse 63   Temp 98.1 F (36.7 C)   Resp 17   Ht 6\' 1"  (1.854 m)   Wt 81.6 kg   SpO2 98%   BMI 23.75 kg/m  Physical Exam Vitals and nursing note reviewed.  Constitutional:      Appearance: Normal appearance.  HENT:     Head: Normocephalic and atraumatic.     Right Ear: There is impacted cerumen.     Left Ear: There is impacted cerumen.     Nose: Nose normal.     Mouth/Throat:     Mouth: Mucous membranes are moist.     Pharynx: Oropharynx is clear.  Eyes:     Extraocular Movements: Extraocular movements intact.     Conjunctiva/sclera: Conjunctivae normal.     Pupils: Pupils are equal, round, and  reactive to light.  Cardiovascular:     Rate and Rhythm: Normal rate and regular rhythm.     Pulses: Normal pulses.     Heart sounds: Normal heart sounds.  Pulmonary:     Effort: Pulmonary effort is normal.     Breath sounds: Normal breath sounds.  Abdominal:     General: Abdomen is flat. Bowel sounds are normal.     Palpations: Abdomen is soft.  Musculoskeletal:        General: Normal range of motion.     Cervical back: Normal range of motion and neck supple.  Skin:    General: Skin is warm.     Capillary Refill: Capillary refill takes less than 2 seconds.  Neurological:     General: No focal deficit present.     Mental Status: He is alert and oriented to person, place, and time.  Psychiatric:        Mood and Affect: Mood normal.        Behavior: Behavior normal.     ED Results / Procedures / Treatments   Labs (all labs ordered are listed, but only abnormal results are displayed)  Labs Reviewed - No data to display  EKG None  Radiology No results found.  Procedures Procedures    Medications Ordered in ED Medications  carbamide peroxide (DEBROX) 6.5 % OTIC (EAR) solution 5 drop (5 drops Both EARS Given 10/19/22 0841)    ED Course/ Medical Decision Making/ A&P                             Medical Decision Making Risk OTC drugs.   Nurse flushed ears and was able to get some ear wax.  Pt is feeling better.  He still has ear wax, so he is given a rx for colace and instructed to follow up with ENT if needed.        Final Clinical Impression(s) / ED Diagnoses Final diagnoses:  Bilateral impacted cerumen    Rx / DC Orders ED Discharge Orders          Ordered    docusate sodium (COLACE) 100 MG capsule        10/19/22 1024              Jacalyn Lefevre, MD 10/19/22 1025

## 2022-10-19 NOTE — ED Notes (Signed)
Bilateral ears soaked with Debrox and flushed.  Cerumen plug flushed from left ear.  Pt states feeling better at this time.  Pt provided Debrox drops and direction for use to continue flushing right ear at home.  Verbalizes understanding of all information shared.

## 2022-10-19 NOTE — ED Triage Notes (Signed)
Pt c/o left ear pain with ringing, feeling stopped up x 3 days and states his right ear is starting. No fever, nasal drainage or cough noted.

## 2022-11-30 ENCOUNTER — Emergency Department (HOSPITAL_COMMUNITY)
Admission: EM | Admit: 2022-11-30 | Discharge: 2022-11-30 | Disposition: A | Discharge status: Home / Self Care | Payer: BC Managed Care – PPO | Attending: Emergency Medicine | Admitting: Emergency Medicine

## 2022-11-30 ENCOUNTER — Emergency Department (HOSPITAL_COMMUNITY): Payer: BC Managed Care – PPO

## 2022-11-30 ENCOUNTER — Other Ambulatory Visit: Payer: Self-pay

## 2022-11-30 ENCOUNTER — Encounter (HOSPITAL_COMMUNITY): Payer: Self-pay

## 2022-11-30 DIAGNOSIS — S8992XA Unspecified injury of left lower leg, initial encounter: Secondary | ICD-10-CM | POA: Diagnosis present

## 2022-11-30 DIAGNOSIS — X58XXXA Exposure to other specified factors, initial encounter: Secondary | ICD-10-CM | POA: Insufficient documentation

## 2022-11-30 DIAGNOSIS — S86912A Strain of unspecified muscle(s) and tendon(s) at lower leg level, left leg, initial encounter: Secondary | ICD-10-CM | POA: Diagnosis not present

## 2022-11-30 DIAGNOSIS — Y9344 Activity, trampolining: Secondary | ICD-10-CM | POA: Diagnosis not present

## 2022-11-30 NOTE — Discharge Instructions (Signed)
Continue taking your 800 mg Motrin's 3 times a day.  Try to stay off your leg is much as possible.  Follow-up with Dr. Romeo Apple or one of his colleagues next week

## 2022-11-30 NOTE — ED Provider Notes (Signed)
   EMERGENCY DEPARTMENT AT [facility] Provider Note   CSN: [id number] Arrival date & time: 11/30/22  2130     History {Add pertinent medical, surgical, social history, OB history to HPI:1} Chief Complaint  Patient presents with   Knee Pain    Bobby Ali is a 37 y.o. male.  Patient was jumping and injured his left leg.  Patient has taken antibiotics and Motrin for a tooth infection.   Knee Pain      Home Medications Prior to Admission medications   Medication Sig Start Date End Date Taking? Authorizing Provider  amoxicillin-clavulanate (AUGMENTIN) 875-125 MG tablet Take 1 tablet by mouth every 12 (twelve) hours. 10/09/21   Triplett, Tammy, PA-C  docusate sodium (COLACE) 100 MG capsule Cut off the end of capsule and apply to ears as needed for cerumen impaction. 10/19/22   Jacalyn Lefevre, MD  HYDROcodone-acetaminophen (NORCO/VICODIN) 5-325 MG tablet Take one tab po q 4 hrs prn pain 10/09/21   Triplett, Tammy, PA-C      Allergies    Patient has no known allergies.    Review of Systems   Review of Systems  Physical Exam Updated Vital Signs BP 119/86   Pulse 67   Temp 97.9 F (36.6 C) (Oral)   Resp 16   Ht 6\' 1"  (1.854 m)   Wt 81.6 kg   SpO2 98%   BMI 23.75 kg/m  Physical Exam  ED Results / Procedures / Treatments   Labs (all labs ordered are listed, but only abnormal results are displayed) Labs Reviewed - No data to display  EKG None  Radiology DG Knee Left Port  Result Date: 11/30/2022 CLINICAL DATA:  37 year old male with knee pain after trampoline injury. EXAM: PORTABLE LEFT KNEE - 1-2 VIEW COMPARISON:  None Available. FINDINGS: Bone mineralization is within normal limits. Mostly anterior soft tissue swelling at the knee. Patella appears intact. Suprapatellar joint effusion difficult to exclude. Maintained joint spaces and alignment. No osseous abnormality identified. IMPRESSION: Anterior soft tissue swelling and possible suprapatellar  joint effusion. No osseous abnormality identified. Electronically Signed   By: Odessa Fleming M.D.   On: 11/30/2022 08:50    Procedures Procedures  {Document cardiac monitor, telemetry assessment procedure when appropriate:1}  Medications Ordered in ED Medications - No data to display  ED Course/ Medical Decision Making/ A&P   {   Click here for ABCD2, HEART and other calculatorsREFRESH Note before signing :1}                          Medical Decision Making Amount and/or Complexity of Data Reviewed Radiology: ordered.   Patient with a sprain left knee.  Patient will continue Motrin 800 mg 3 times a day and follow-up with Ortho  {Document critical care time when appropriate:1} {Document review of labs and clinical decision tools ie heart score, Chads2Vasc2 etc:1}  {Document your independent review of radiology images, and any outside records:1} {Document your discussion with family members, caretakers, and with consultants:1} {Document social determinants of health affecting pt's care:1} {Document your decision making why or why not admission, treatments were needed:1} Final Clinical Impression(s) / ED Diagnoses Final diagnoses:  Strain of left knee, initial encounter    Rx / DC Orders ED Discharge Orders     None

## 2022-11-30 NOTE — ED Triage Notes (Signed)
Pt comes in with complaints of left knee pain starting two days ago after jumping off a trampoline. Pt states he feels like the knee has fluid in it, unable to bend it, and sore to touch.

## 2022-12-15 ENCOUNTER — Emergency Department (HOSPITAL_COMMUNITY)
Admission: EM | Admit: 2022-12-15 | Discharge: 2022-12-15 | Disposition: A | Discharge status: Home / Self Care | Payer: BC Managed Care – PPO | Attending: Emergency Medicine | Admitting: Emergency Medicine

## 2022-12-15 ENCOUNTER — Encounter (HOSPITAL_COMMUNITY): Payer: Self-pay | Admitting: Pharmacy Technician

## 2022-12-15 ENCOUNTER — Other Ambulatory Visit: Payer: Self-pay

## 2022-12-15 ENCOUNTER — Emergency Department (HOSPITAL_COMMUNITY): Payer: BC Managed Care – PPO

## 2022-12-15 DIAGNOSIS — M542 Cervicalgia: Secondary | ICD-10-CM | POA: Diagnosis present

## 2022-12-15 DIAGNOSIS — J45909 Unspecified asthma, uncomplicated: Secondary | ICD-10-CM | POA: Insufficient documentation

## 2022-12-15 DIAGNOSIS — W51XXXA Accidental striking against or bumped into by another person, initial encounter: Secondary | ICD-10-CM | POA: Diagnosis not present

## 2022-12-15 DIAGNOSIS — Y9361 Activity, american tackle football: Secondary | ICD-10-CM | POA: Insufficient documentation

## 2022-12-15 DIAGNOSIS — M79601 Pain in right arm: Secondary | ICD-10-CM | POA: Diagnosis not present

## 2022-12-15 MED ORDER — KETOROLAC TROMETHAMINE 60 MG/2ML IM SOLN
30.0000 mg | Freq: Once | INTRAMUSCULAR | Status: AC
  Administered 2022-12-15: 30 mg via INTRAMUSCULAR
  Filled 2022-12-15: qty 2

## 2022-12-15 MED ORDER — METHOCARBAMOL 500 MG PO TABS: 500.0000 mg | ORAL_TABLET | Freq: Three times a day (TID) | ORAL | 0 refills | Status: DC | PRN

## 2022-12-15 MED ORDER — ACETAMINOPHEN 325 MG PO TABS
650.0000 mg | ORAL_TABLET | Freq: Once | ORAL | Status: AC
Start: 2022-12-15 — End: 2022-12-15
  Administered 2022-12-15: 650 mg via ORAL
  Filled 2022-12-15: qty 2

## 2022-12-15 MED ORDER — METHOCARBAMOL 500 MG PO TABS
1000.0000 mg | ORAL_TABLET | Freq: Once | ORAL | Status: AC
  Administered 2022-12-15: 1000 mg via ORAL
  Filled 2022-12-15: qty 2

## 2022-12-15 MED ORDER — LIDOCAINE 5 % EX PTCH
1.0000 | MEDICATED_PATCH | CUTANEOUS | Status: DC
  Administered 2022-12-15: 1 via TRANSDERMAL
  Filled 2022-12-15: qty 1

## 2022-12-15 NOTE — ED Provider Notes (Signed)
[place] EMERGENCY DEPARTMENT AT [facility] Provider Note   CSN: [id number] Arrival date & time: 12/15/22  3329     History  Chief Complaint  Patient presents with   Arm Pain   Neck Pain    Bobby Ali is a 37 y.o. male.  HPI Patient presents for neck and arm pain.  Medical history includes asthma, GERD, migraine headaches.  He was seen in the ED 2 weeks ago for left knee pain.  He states that his knee has been feeling better.  5 days ago, he woke up with neck pain and right arm pain.  He states that he was playing tackle football the day before.  He describes location of pain in his neck as area of C7.  He describes pain in his right arm pain as posterior upper and lower arm.  Arm pain is worsened with flexion across his body.  He does not have shoulder discomfort.  He denies chest pain.  He has been treating his neck and arm pain with ibuprofen and Tylenol.  Last doses were yesterday.    Home Medications Prior to Admission medications   Medication Sig Start Date End Date Taking? Authorizing Provider  methocarbamol (ROBAXIN) 500 MG tablet Take 1 tablet (500 mg total) by mouth every 8 (eight) hours as needed for muscle spasms. 12/15/22  Yes Gloris Manchester, MD  amoxicillin-clavulanate (AUGMENTIN) 875-125 MG tablet Take 1 tablet by mouth every 12 (twelve) hours. 10/09/21   Triplett, Tammy, PA-C  docusate sodium (COLACE) 100 MG capsule Cut off the end of capsule and apply to ears as needed for cerumen impaction. 10/19/22   Jacalyn Lefevre, MD  HYDROcodone-acetaminophen (NORCO/VICODIN) 5-325 MG tablet Take one tab po q 4 hrs prn pain 10/09/21   Triplett, Tammy, PA-C      Allergies    Patient has no known allergies.    Review of Systems   Review of Systems  Musculoskeletal:  Positive for myalgias and neck pain.  All other systems reviewed and are negative.   Physical Exam Updated Vital Signs BP (!) 138/101   Pulse 65   Temp 97.8 F (36.6 C)   Resp 17   SpO2 97%   Physical Exam Vitals and nursing note reviewed.  Constitutional:      General: He is not in acute distress.    Appearance: Normal appearance. He is well-developed. He is not ill-appearing, toxic-appearing or diaphoretic.  HENT:     Head: Normocephalic and atraumatic.     Right Ear: External ear normal.     Left Ear: External ear normal.     Nose: Nose normal.     Mouth/Throat:     Mouth: Mucous membranes are moist.     Comments: Poor dentition Eyes:     Extraocular Movements: Extraocular movements intact.     Conjunctiva/sclera: Conjunctivae normal.  Cardiovascular:     Rate and Rhythm: Normal rate and regular rhythm.  Pulmonary:     Effort: Pulmonary effort is normal. No respiratory distress.  Abdominal:     General: Abdomen is flat. There is no distension.     Palpations: Abdomen is soft.  Musculoskeletal:        General: No swelling, tenderness or deformity. Normal range of motion.     Cervical back: Normal range of motion and neck supple. No tenderness.     Right lower leg: No edema.     Left lower leg: No edema.  Skin:    General: Skin is  warm and dry.     Coloration: Skin is not jaundiced or pale.  Neurological:     General: No focal deficit present.     Mental Status: He is alert and oriented to person, place, and time.     Cranial Nerves: No cranial nerve deficit.     Sensory: No sensory deficit.     Motor: No weakness.     Coordination: Coordination normal.  Psychiatric:        Mood and Affect: Mood normal.        Behavior: Behavior normal.        Thought Content: Thought content normal.        Judgment: Judgment normal.     ED Results / Procedures / Treatments   Labs (all labs ordered are listed, but only abnormal results are displayed) Labs Reviewed - No data to display  EKG None  Radiology CT Cervical Spine Wo Contrast  Result Date: 12/15/2022 CLINICAL DATA:  Neck trauma, injury, right arm weakness EXAM: CT CERVICAL SPINE WITHOUT CONTRAST  TECHNIQUE: Multidetector CT imaging of the cervical spine was performed without intravenous contrast. Multiplanar CT image reconstructions were also generated. RADIATION DOSE REDUCTION: This exam was performed according to the departmental dose-optimization program which includes automated exposure control, adjustment of the mA and/or kV according to patient size and/or use of iterative reconstruction technique. COMPARISON:  None Available. FINDINGS: Alignment: Slight cervical kyphotic curvature noted which may be positional. Facets are aligned. No subluxation or dislocation. Skull base and vertebrae: No acute fracture. No primary bone lesion or focal pathologic process. Soft tissues and spinal canal: No prevertebral fluid or swelling. No visible canal hematoma. Disc levels: Minimal disc space narrowing and endplate bony spurring anteriorly at C5-6 and C6-7. Upper chest: Negative. Other: Small prominent but nonenlarged cervical lymph nodes noted bilaterally. IMPRESSION: 1. No acute osseous abnormality of the cervical spine. 2. Slight cervical kyphotic curvature may be positional. 3. Minimal degenerative disc disease at C5-6 and C6-7. Electronically Signed   By: Judie Petit.  Shick M.D.   On: 12/15/2022 09:08    Procedures Procedures    Medications Ordered in ED Medications  lidocaine (LIDODERM) 5 % 1 patch (1 patch Transdermal Patch Applied 12/15/22 0837)  ketorolac (TORADOL) injection 30 mg (30 mg Intramuscular Given 12/15/22 0837)  methocarbamol (ROBAXIN) tablet 1,000 mg (1,000 mg Oral Given 12/15/22 0836)  acetaminophen (TYLENOL) tablet 650 mg (650 mg Oral Given 12/15/22 2130)    ED Course/ Medical Decision Making/ A&P                                 Medical Decision Making Amount and/or Complexity of Data Reviewed Radiology: ordered.  Risk OTC drugs. Prescription drug management.   Patient presenting for 5 days of neck and right arm pain.  On arrival in the ED, patient is well-appearing.  He  describes area of neck pain as region of C7.  He has no midline tenderness in this area.  He describes areas of arm pain as posterior upper and lower arm.  No deformities are noted on exam.  No skin changes are present.  Range of motion and strength are intact.  Patient was given multimodal pain control in the ED.  CT of cervical spine was ordered.  CT did not show any acute findings.  There is minimal degenerative disc disease.  Patient had improved symptoms while in the ED.  He was advised to continue  ibuprofen and Tylenol at home.  Prescription for Robaxin was ordered.  He was discharged in good condition.        Final Clinical Impression(s) / ED Diagnoses Final diagnoses:  Neck pain  Right arm pain    Rx / DC Orders ED Discharge Orders          Ordered    methocarbamol (ROBAXIN) 500 MG tablet  Every 8 hours PRN        12/15/22 0940              Gloris Manchester, MD 12/15/22 [phone]

## 2022-12-15 NOTE — ED Triage Notes (Signed)
Pt here via POV with complaints of neck pain radiating into his R arm after getting hit while playing football last week. Endorses increased weakness in R arm.

## 2022-12-15 NOTE — Discharge Instructions (Addendum)
Continue ibuprofen and Tylenol as needed.  A prescription for a muscle relaxer was sent to your pharmacy.  Take this as needed as well.  Return to the emergency department for any new or worsening symptoms of concern.

## 2022-12-23 ENCOUNTER — Other Ambulatory Visit: Payer: Self-pay

## 2022-12-23 ENCOUNTER — Ambulatory Visit
Admission: RE | Admit: 2022-12-23 | Discharge: 2022-12-23 | Disposition: A | Discharge status: Home / Self Care | Payer: BC Managed Care – PPO | Source: Ambulatory Visit

## 2022-12-23 VITALS — BP 126/94 | HR 77 | Temp 98.8°F | Resp 20

## 2022-12-23 DIAGNOSIS — B9789 Other viral agents as the cause of diseases classified elsewhere: Secondary | ICD-10-CM | POA: Insufficient documentation

## 2022-12-23 DIAGNOSIS — J069 Acute upper respiratory infection, unspecified: Secondary | ICD-10-CM | POA: Insufficient documentation

## 2022-12-23 DIAGNOSIS — F17210 Nicotine dependence, cigarettes, uncomplicated: Secondary | ICD-10-CM | POA: Diagnosis not present

## 2022-12-23 DIAGNOSIS — Z1152 Encounter for screening for COVID-19: Secondary | ICD-10-CM | POA: Insufficient documentation

## 2022-12-23 DIAGNOSIS — R059 Cough, unspecified: Secondary | ICD-10-CM | POA: Diagnosis present

## 2022-12-23 MED ORDER — PROMETHAZINE-DM 6.25-15 MG/5ML PO SYRP: 5.0000 mL | ORAL_SOLUTION | Freq: Four times a day (QID) | ORAL | 0 refills | Status: AC | PRN

## 2022-12-23 NOTE — ED Triage Notes (Signed)
Pt reports chest and nasal congestion since Saturday. Denies any known exposures.

## 2022-12-24 LAB — SARS CORONAVIRUS 2 (TAT 6-24 HRS): SARS Coronavirus 2: NEGATIVE

## 2022-12-26 NOTE — ED Provider Notes (Signed)
[facility]    CSN: [id number] Arrival date & time: 12/23/22  1832      History   Chief Complaint Chief Complaint  Patient presents with   Cough    Chest congestion nasal congestion - Entered by patient    HPI Bobby Ali is a 37 y.o. male.   Presenting today with 3-day history of congestion, cough.  Denies fever, chills, chest pain, shortness of breath, abdominal pain, nausea vomiting or diarrhea.  So far trying over-the-counter remedies with minimal relief.    Past Medical History:  Diagnosis Date   Asthma    GERD (gastroesophageal reflux disease)    Hx of migraine headaches 1998   15 years per patient     Patient Active Problem List   Diagnosis Date Noted   GERD (gastroesophageal reflux disease) 06/06/2013   Pain in joint, shoulder region 08/02/2012   Muscle weakness (generalized) 08/02/2012    Past Surgical History:  Procedure Laterality Date   ESOPHAGOGASTRODUODENOSCOPY N/A 06/16/2013   Procedure: ESOPHAGOGASTRODUODENOSCOPY (EGD);  Surgeon: Malissa Hippo, MD;  Location: [facility];  Service: Endoscopy;  Laterality: N/A;  250-moved to 1155 Ann to notify pt   FINGER FRACTURE SURGERY     PILONIDAL CYST EXCISION         Home Medications    Prior to Admission medications   Medication Sig Start Date End Date Taking? Authorizing Provider  clindamycin (CLEOCIN) 150 MG capsule Take by mouth 3 (three) times daily.   Yes [provider]  promethazine-dextromethorphan (PROMETHAZINE-DM) 6.25-15 MG/5ML syrup Take 5 mLs by mouth 4 (four) times daily as needed. 12/23/22  Yes Particia Nearing, PA-C  amoxicillin-clavulanate (AUGMENTIN) 875-125 MG tablet Take 1 tablet by mouth every 12 (twelve) hours. 10/09/21   Triplett, Tammy, PA-C  docusate sodium (COLACE) 100 MG capsule Cut off the end of capsule and apply to ears as needed for cerumen impaction. 10/19/22   Jacalyn Lefevre, MD  HYDROcodone-acetaminophen (NORCO/VICODIN) 5-325 MG tablet Take  one tab po q 4 hrs prn pain 10/09/21   Triplett, Tammy, PA-C  methocarbamol (ROBAXIN) 500 MG tablet Take 1 tablet (500 mg total) by mouth every 8 (eight) hours as needed for muscle spasms. Patient not taking: Reported on 12/23/2022 12/15/22   Gloris Manchester, MD    Family History Family History  Problem Relation Age of Onset   Colon cancer Neg Hx     Social History Social History   Tobacco Use   Smoking status: Every Day    Current packs/day: 1.50    Average packs/day: 1.5 packs/day for 7.0 years (10.5 ttl pk-yrs)    Types: Cigarettes   Smokeless tobacco: Never   Tobacco comments:    x 7 rs.   Vaping Use   Vaping status: Never Used  Substance Use Topics   Alcohol use: Yes    Comment: occasional   Drug use: No     Allergies   Patient has no known allergies.   Review of Systems Review of Systems Per HPI  Physical Exam Triage Vital Signs ED Triage Vitals [12/23/22 1900]  Encounter Vitals Group     BP (!) 126/94     Systolic BP Percentile      Diastolic BP Percentile      Pulse Rate 77     Resp 20     Temp 98.8 F (37.1 C)     Temp Source Oral     SpO2 98 %     Weight  Height      Head Circumference      Peak Flow      Pain Score 5     Pain Loc      Pain Education      Exclude from Growth Chart    No data found.  Updated Vital Signs BP (!) 126/94 (BP Location: Right Arm)   Pulse 77   Temp 98.8 F (37.1 C) (Oral)   Resp 20   SpO2 98%   Visual Acuity Right Eye Distance:   Left Eye Distance:   Bilateral Distance:    Right Eye Near:   Left Eye Near:    Bilateral Near:     Physical Exam Vitals and nursing note reviewed.  Constitutional:      Appearance: He is well-developed.  HENT:     Head: Atraumatic.     Right Ear: External ear normal.     Left Ear: External ear normal.     Nose: Rhinorrhea present.     Mouth/Throat:     Pharynx: Posterior oropharyngeal erythema present. No oropharyngeal exudate.  Eyes:     Conjunctiva/sclera:  Conjunctivae normal.     Pupils: Pupils are equal, round, and reactive to light.  Cardiovascular:     Rate and Rhythm: Normal rate and regular rhythm.  Pulmonary:     Effort: Pulmonary effort is normal. No respiratory distress.     Breath sounds: No wheezing or rales.  Musculoskeletal:        General: Normal range of motion.     Cervical back: Normal range of motion and neck supple.  Lymphadenopathy:     Cervical: No cervical adenopathy.  Skin:    General: Skin is warm and dry.  Neurological:     Mental Status: He is alert and oriented to person, place, and time.  Psychiatric:        Behavior: Behavior normal.      UC Treatments / Results  Labs (all labs ordered are listed, but only abnormal results are displayed) Labs Reviewed  SARS CORONAVIRUS 2 (TAT 6-24 HRS)    EKG   Radiology No results found.  Procedures Procedures (including critical care time)  Medications Ordered in UC Medications - No data to display  Initial Impression / Assessment and Plan / UC Course  I have reviewed the triage vital signs and the nursing notes.  Pertinent labs & imaging results that were available during my care of the patient were reviewed by me and considered in my medical decision making (see chart for details).     Vitals and exam reassuring and suggestive of a viral respiratory infection.  COVID testing pending, discussed Phenergan DM, supportive over-the-counter medications and home care.  Return for worsening symptoms.  Final Clinical Impressions(s) / UC Diagnoses   Final diagnoses:  Viral URI with cough   Discharge Instructions   None    ED Prescriptions     Medication Sig Dispense Auth. Provider   promethazine-dextromethorphan (PROMETHAZINE-DM) 6.25-15 MG/5ML syrup Take 5 mLs by mouth 4 (four) times daily as needed. 100 mL Particia Nearing, [place]      PDMP not reviewed this encounter.   Particia Nearing, [place] 12/26/22 1556

## 2023-05-09 ENCOUNTER — Emergency Department (HOSPITAL_COMMUNITY): Payer: BC Managed Care – PPO

## 2023-05-09 ENCOUNTER — Encounter (HOSPITAL_COMMUNITY): Payer: Self-pay

## 2023-05-09 ENCOUNTER — Other Ambulatory Visit: Payer: Self-pay

## 2023-05-09 ENCOUNTER — Emergency Department (HOSPITAL_COMMUNITY)
Admission: EM | Admit: 2023-05-09 | Discharge: 2023-05-09 | Disposition: A | Discharge status: Home / Self Care | Payer: BC Managed Care – PPO | Attending: Emergency Medicine | Admitting: Emergency Medicine

## 2023-05-09 DIAGNOSIS — M545 Low back pain, unspecified: Secondary | ICD-10-CM | POA: Diagnosis present

## 2023-05-09 DIAGNOSIS — W19XXXA Unspecified fall, initial encounter: Secondary | ICD-10-CM

## 2023-05-09 DIAGNOSIS — S56912A Strain of unspecified muscles, fascia and tendons at forearm level, left arm, initial encounter: Secondary | ICD-10-CM

## 2023-05-09 DIAGNOSIS — S53402A Unspecified sprain of left elbow, initial encounter: Secondary | ICD-10-CM | POA: Diagnosis not present

## 2023-05-09 DIAGNOSIS — J45909 Unspecified asthma, uncomplicated: Secondary | ICD-10-CM | POA: Insufficient documentation

## 2023-05-09 DIAGNOSIS — S39012A Strain of muscle, fascia and tendon of lower back, initial encounter: Secondary | ICD-10-CM | POA: Insufficient documentation

## 2023-05-09 DIAGNOSIS — W01198A Fall on same level from slipping, tripping and stumbling with subsequent striking against other object, initial encounter: Secondary | ICD-10-CM | POA: Diagnosis not present

## 2023-05-09 MED ORDER — NAPROXEN 375 MG PO TABS: 375.0000 mg | ORAL_TABLET | Freq: Two times a day (BID) | ORAL | 0 refills | Status: AC

## 2023-05-09 MED ORDER — METHOCARBAMOL 500 MG PO TABS: 500.0000 mg | ORAL_TABLET | Freq: Three times a day (TID) | ORAL | 0 refills | Status: AC | PRN

## 2023-05-09 NOTE — Discharge Instructions (Signed)
 Take the medications as needed for pain and discomfort.  Follow-up with a primary care doctor or orthopedic doctor to be rechecked if the symptoms do not resolve over the next week

## 2023-05-09 NOTE — ED Triage Notes (Signed)
 Patient stated comes in due had fell four days ago, fell straight onto back and hit Elbow. Denise's hitting head. Back spasms started shortly after and noticed that it has been hard to bend elbow and hold stuff.
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
## 2023-06-09 IMAGING — CT CT PELVIS W/ CM
2 of 3 series · 17 of 46 positions shown, 19 images · IV contrast (omnipaque)
Comparison: January 13, 2021

CLINICAL DATA: Suspected perianal abscess.

EXAM:
CT PELVIS WITH CONTRAST
TECHNIQUE: Multidetector CT imaging of the pelvis was performed using the
standard protocol following the bolus administration of intravenous
contrast.
CONTRAST:  100mL OMNIPAQUE IOHEXOL 300 MG/ML  SOLN

[Series 2: axial st · axial · 0.98mm/px · z∈[+815,+1130]mm · 14 of 73 slices shown, 16 images]
[im 5/73  soft-tissue]
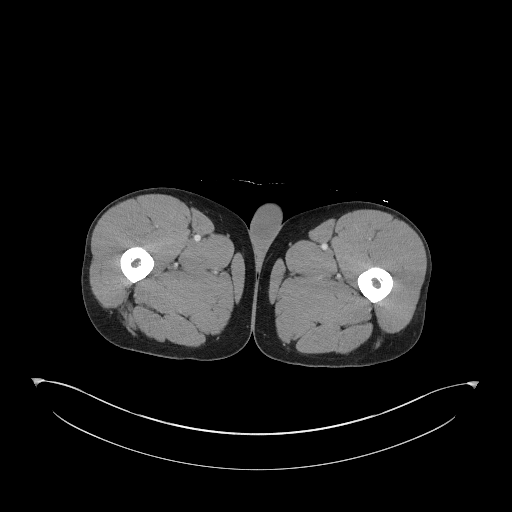
[im 5/73  bone]
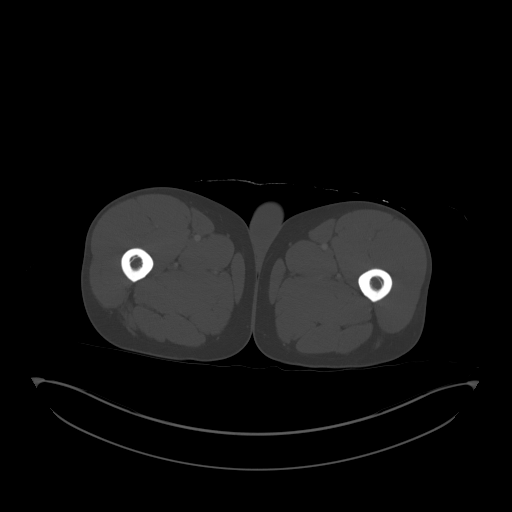
[im 10/73  soft-tissue]
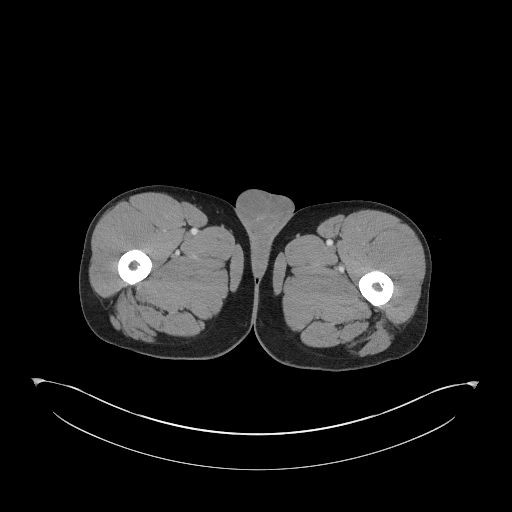
[im 14/73  soft-tissue]
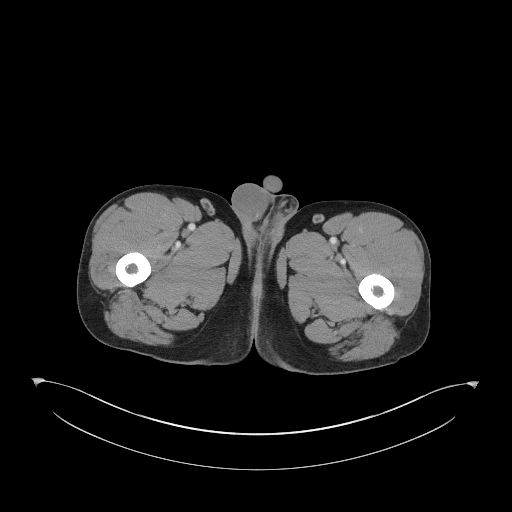
[im 19/73  soft-tissue]
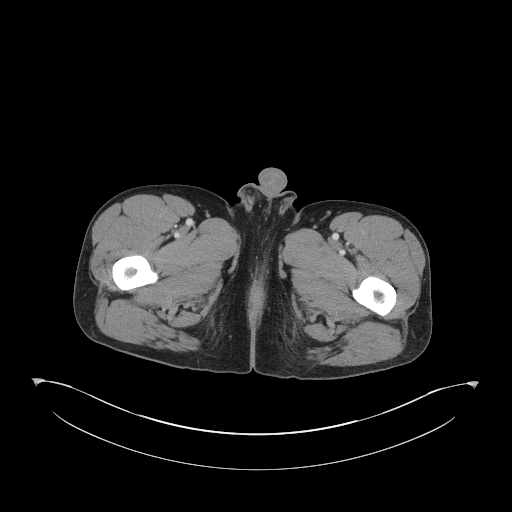
[im 24/73  soft-tissue]
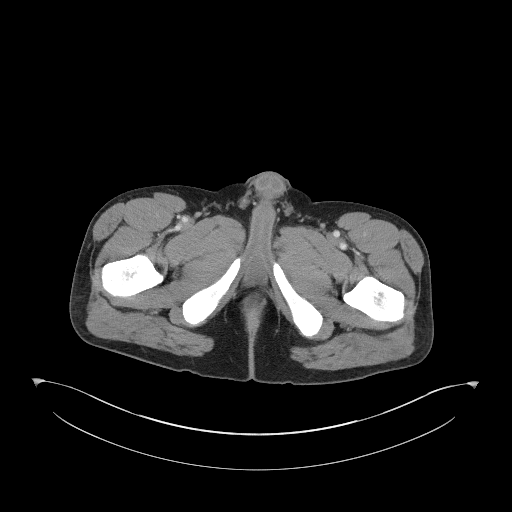
[im 28/73  soft-tissue]
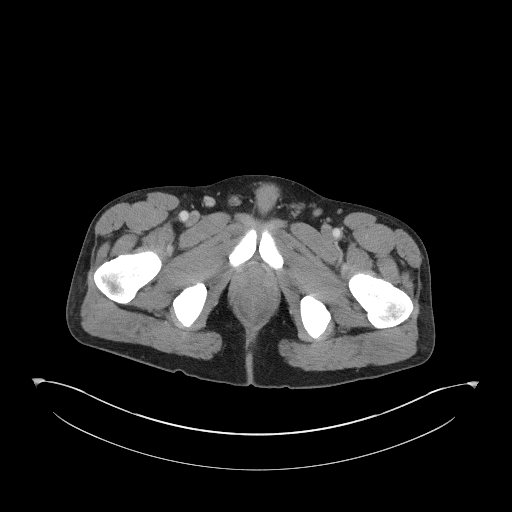
[im 33/73  soft-tissue]
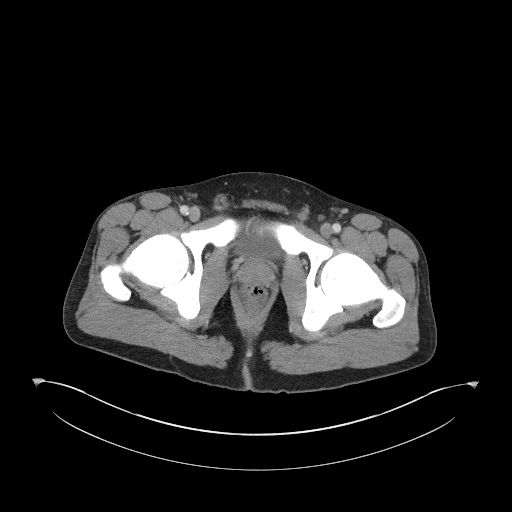
[im 40/73  soft-tissue]
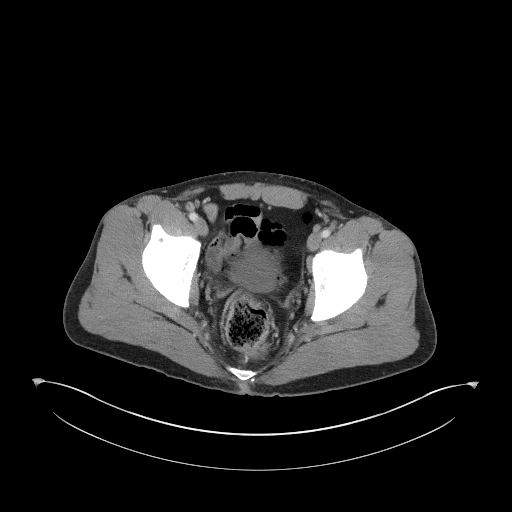
[im 45/73  soft-tissue]
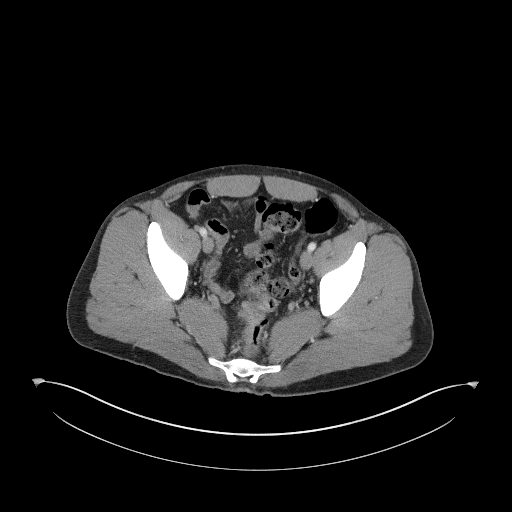
[im 45/73  bone]
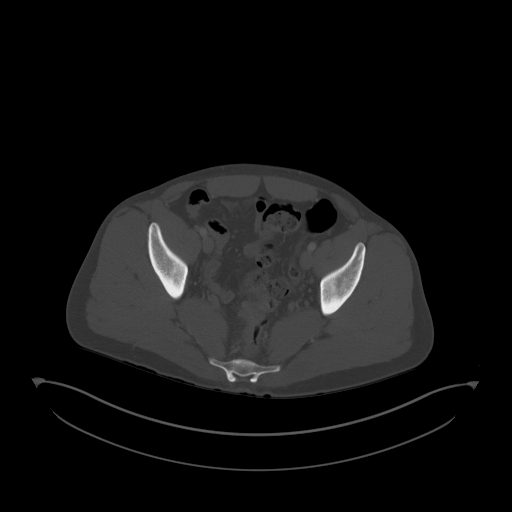
[im 49/73  soft-tissue]
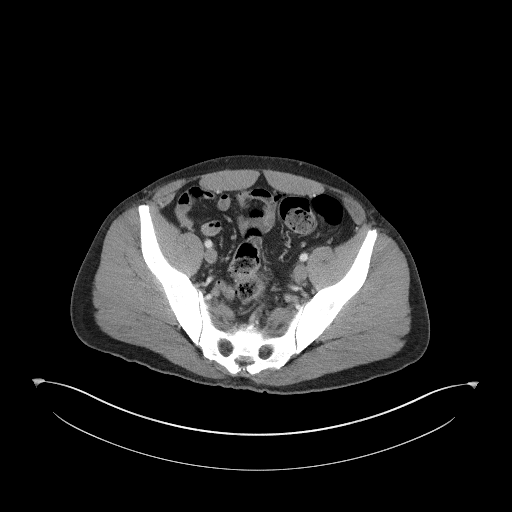
[im 54/73  soft-tissue]
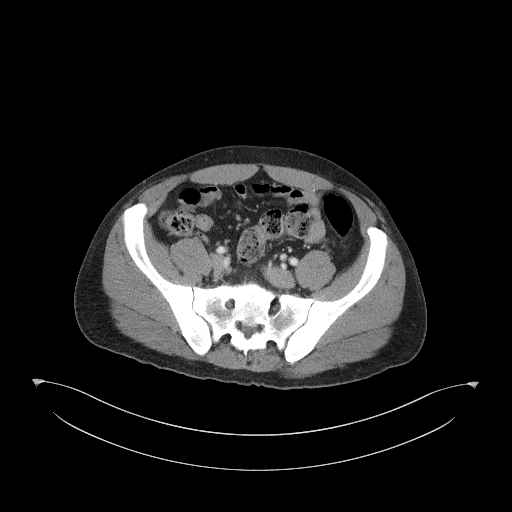
[im 59/73  soft-tissue]
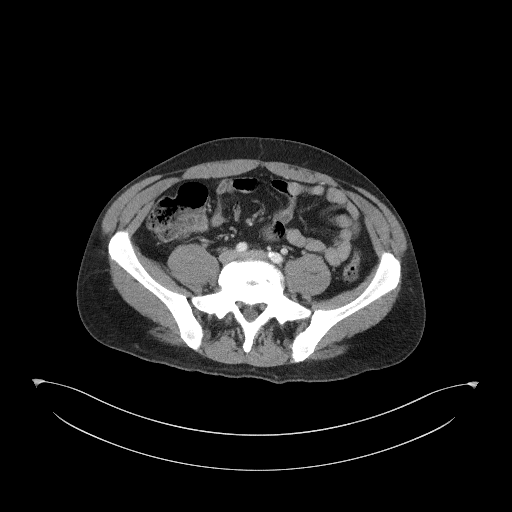
[im 63/73  soft-tissue]
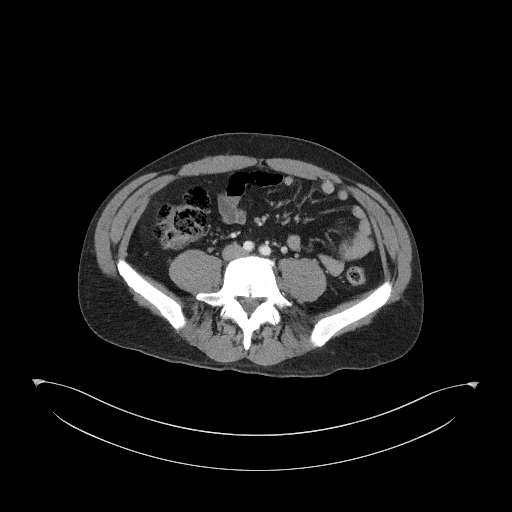
[im 68/73  soft-tissue]
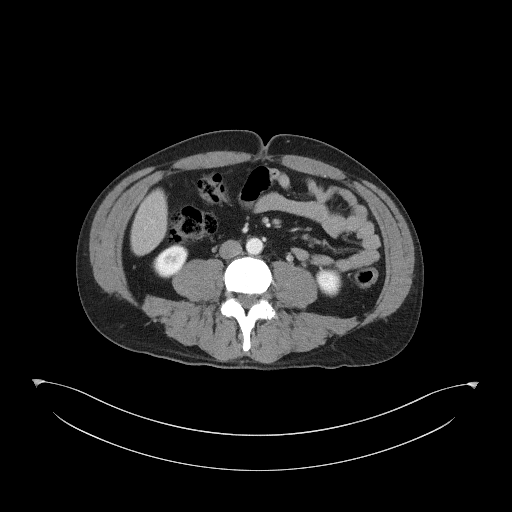

[Series 4: coronal st · coronal · 0.76mm/px · 3 of 98 slices shown]
[im 33/98  soft-tissue]
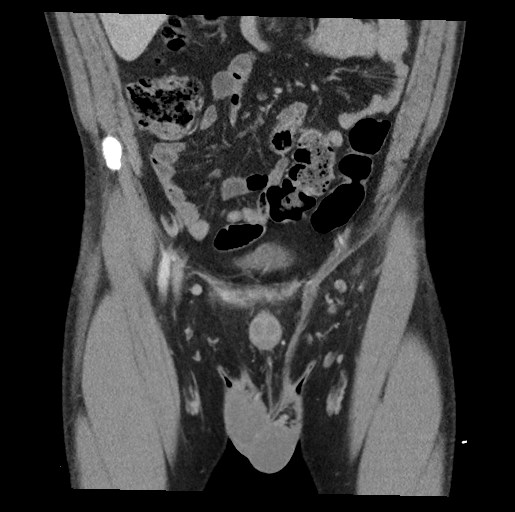
[im 44/98  soft-tissue]
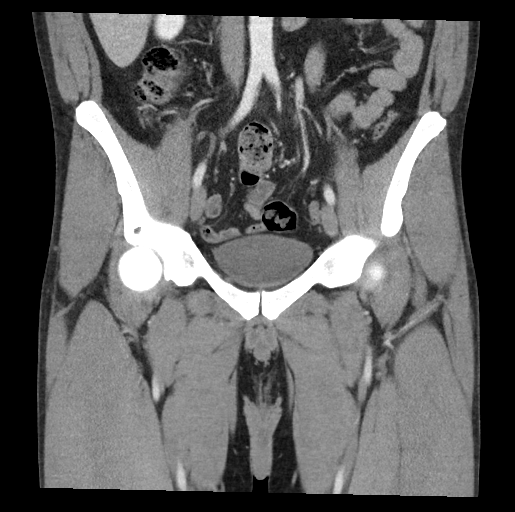
[im 54/98  soft-tissue]
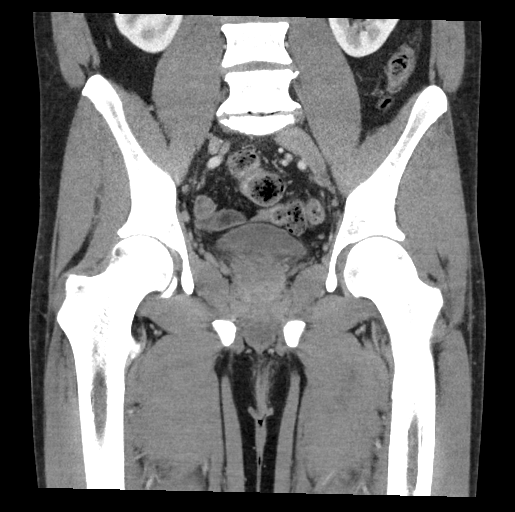

[17 of 46 positions shown; findings below may reference images not displayed]

FINDINGS: Urinary Tract:  No abnormality visualized.

Bowel:  Unremarkable visualized pelvic bowel loops.

Vascular/Lymphatic: No pathologically enlarged lymph nodes. No
significant vascular abnormality seen.

Reproductive:  No mass or other significant abnormality

Other: 2.6 x 1.5 cm rim enhancing collection in the medial left
buttock situated between the anus and scrotum. Mild surrounding
inflammatory changes. No clear connection to the rectum or scrotum
is seen.

Musculoskeletal: No suspicious bone lesions identified.
IMPRESSION: 2.6 x 1.5 cm recurrent perianal abscess. No clear connection to the
rectum or scrotum is seen.

## 2023-08-03 ENCOUNTER — Other Ambulatory Visit: Payer: Self-pay

## 2023-08-03 ENCOUNTER — Emergency Department (HOSPITAL_COMMUNITY)
Admission: EM | Admit: 2023-08-03 | Discharge: 2023-08-03 | Disposition: A | Discharge status: Home / Self Care | Attending: Emergency Medicine | Admitting: Emergency Medicine

## 2023-08-03 ENCOUNTER — Emergency Department (HOSPITAL_COMMUNITY)

## 2023-08-03 ENCOUNTER — Encounter (HOSPITAL_COMMUNITY): Payer: Self-pay | Admitting: Emergency Medicine

## 2023-08-03 DIAGNOSIS — S91104A Unspecified open wound of right lesser toe(s) without damage to nail, initial encounter: Secondary | ICD-10-CM | POA: Diagnosis present

## 2023-08-03 DIAGNOSIS — S92534A Nondisplaced fracture of distal phalanx of right lesser toe(s), initial encounter for closed fracture: Secondary | ICD-10-CM | POA: Insufficient documentation

## 2023-08-03 DIAGNOSIS — W2209XA Striking against other stationary object, initial encounter: Secondary | ICD-10-CM | POA: Insufficient documentation

## 2023-08-03 DIAGNOSIS — Y9366 Activity, soccer: Secondary | ICD-10-CM | POA: Insufficient documentation

## 2023-08-03 MED ORDER — IBUPROFEN 400 MG PO TABS
600.0000 mg | ORAL_TABLET | Freq: Once | ORAL | Status: AC
  Administered 2023-08-03: 600 mg via ORAL
  Filled 2023-08-03: qty 2

## 2023-08-03 NOTE — Discharge Instructions (Signed)
 Follow-up with the orthopedist in about 2 weeks.  Call for an appointment.  You may take ibuprofen and/or Tylenol to help with pain.

## 2023-08-03 NOTE — ED Triage Notes (Signed)
 Pt c/o toe injury that happened yesterday. Pt states he was playing football with son and hit right toe beside of small toe on a rock.

## 2023-08-03 NOTE — ED Provider Notes (Signed)
 [place] EMERGENCY DEPARTMENT AT [facility] Provider Note   CSN: [id number] Arrival date & time: 08/03/23  1027     History  Chief Complaint  Patient presents with   Toe Injury    Bobby Ali is a 38 y.o. male.  HPI 38 year old male presents with a right fourth toe injury.  He was playing football with his son and as he went to catch a football his foot struck a rock.  Has bruising and swelling.  No other injuries.  No numbness.  Home Medications Prior to Admission medications   Medication Sig Start Date End Date Taking? Authorizing Provider  amoxicillin-clavulanate (AUGMENTIN) 875-125 MG tablet Take 1 tablet by mouth every 12 (twelve) hours. 10/09/21   Triplett, Tammy, PA-C  clindamycin (CLEOCIN) 150 MG capsule Take by mouth 3 (three) times daily.    [provider]  docusate sodium (COLACE) 100 MG capsule Cut off the end of capsule and apply to ears as needed for cerumen impaction. 10/19/22   Jacalyn Lefevre, MD  HYDROcodone-acetaminophen (NORCO/VICODIN) 5-325 MG tablet Take one tab po q 4 hrs prn pain 10/09/21   Triplett, Tammy, PA-C  methocarbamol (ROBAXIN) 500 MG tablet Take 1 tablet (500 mg total) by mouth every 8 (eight) hours as needed for muscle spasms. 05/09/23   Linwood Dibbles, MD  naproxen (NAPROSYN) 375 MG tablet Take 1 tablet (375 mg total) by mouth 2 (two) times daily. 05/09/23   Linwood Dibbles, MD  promethazine-dextromethorphan (PROMETHAZINE-DM) 6.25-15 MG/5ML syrup Take 5 mLs by mouth 4 (four) times daily as needed. 12/23/22   Particia Nearing, PA-C      Allergies    Patient has no known allergies.    Review of Systems   Review of Systems  Musculoskeletal:  Positive for arthralgias.    Physical Exam Updated Vital Signs BP (!) 122/90 (BP Location: Right Arm)   Pulse 67   Temp 98.2 F (36.8 C) (Oral)   Resp 15   Ht 6\' 1"  (1.854 m)   Wt 83.9 kg   SpO2 98%   BMI 24.41 kg/m  Physical Exam Vitals and nursing note reviewed.  Constitutional:       Appearance: He is well-developed.  HENT:     Head: Normocephalic and atraumatic.  Cardiovascular:     Rate and Rhythm: Normal rate and regular rhythm.     Pulses:          Dorsalis pedis pulses are 2+ on the right side.  Pulmonary:     Effort: Pulmonary effort is normal.  Musculoskeletal:     Right foot: Swelling and tenderness present. No deformity.     Comments: Right 4th toes is ecchymotic and mildly swollen. No tenderness proximal to the toe. No laceration.  Skin:    General: Skin is warm and dry.  Neurological:     Mental Status: He is alert.     ED Results / Procedures / Treatments   Labs (all labs ordered are listed, but only abnormal results are displayed) Labs Reviewed - No data to display  EKG None  Radiology DG Toe 4th Right Result Date: 08/03/2023 CLINICAL DATA:  Right fourth toe injury. EXAM: RIGHT FOURTH TOE COMPARISON:  None Available. FINDINGS: Three views study shows a fracture involving the dorsal base of the fourth toe distal phalanx, only really visible on the lateral projection. No other acute fracture evident. No dislocation. No worrisome lytic or sclerotic osseous abnormality. IMPRESSION: Fracture involving the dorsal base of the fourth toe  distal phalanx. Electronically Signed   By: Kennith Center M.D.   On: 08/03/2023 09:00    Procedures Procedures    Medications Ordered in ED Medications  ibuprofen (ADVIL) tablet 600 mg (600 mg Oral Given 08/03/23 6045)    ED Course/ Medical Decision Making/ A&P                                 Medical Decision Making Amount and/or Complexity of Data Reviewed Radiology: ordered and independent interpretation performed.    Details: Toe fracture  Risk Prescription drug management.   X-ray shows a toe fracture.  This is a closed injury.  Will buddy tape and put in a postop shoe and have him follow-up with orthopedics as an outpatient.        Final Clinical Impression(s) / ED Diagnoses Final  diagnoses:  Closed nondisplaced fracture of distal phalanx of lesser toe of right foot, initial encounter    Rx / DC Orders ED Discharge Orders     None         Pricilla Loveless, MD 08/03/23 [phone]

## 2023-12-27 ENCOUNTER — Emergency Department (HOSPITAL_COMMUNITY)
Admission: EM | Admit: 2023-12-27 | Discharge: 2023-12-27 | Disposition: A | Discharge status: Home / Self Care | Attending: Emergency Medicine | Admitting: Emergency Medicine

## 2023-12-27 ENCOUNTER — Other Ambulatory Visit: Payer: Self-pay

## 2023-12-27 ENCOUNTER — Emergency Department (HOSPITAL_COMMUNITY)

## 2023-12-27 ENCOUNTER — Encounter (HOSPITAL_COMMUNITY): Payer: Self-pay

## 2023-12-27 DIAGNOSIS — M25511 Pain in right shoulder: Secondary | ICD-10-CM | POA: Diagnosis present

## 2023-12-27 DIAGNOSIS — S5001XA Contusion of right elbow, initial encounter: Secondary | ICD-10-CM | POA: Insufficient documentation

## 2023-12-27 DIAGNOSIS — J45909 Unspecified asthma, uncomplicated: Secondary | ICD-10-CM | POA: Insufficient documentation

## 2023-12-27 DIAGNOSIS — Y9389 Activity, other specified: Secondary | ICD-10-CM | POA: Diagnosis not present

## 2023-12-27 DIAGNOSIS — W1830XA Fall on same level, unspecified, initial encounter: Secondary | ICD-10-CM | POA: Diagnosis not present

## 2023-12-27 DIAGNOSIS — S46911A Strain of unspecified muscle, fascia and tendon at shoulder and upper arm level, right arm, initial encounter: Secondary | ICD-10-CM | POA: Insufficient documentation

## 2023-12-27 DIAGNOSIS — W19XXXA Unspecified fall, initial encounter: Secondary | ICD-10-CM

## 2023-12-27 NOTE — ED Provider Notes (Signed)
 [name] Provider Note   CSN: [id number] Arrival date & time: 12/27/23  9261     Patient presents with: Bobby Ali is a 38 y.o. male.   Pt is a 38 yo male with pmhx significant for asthma and gerd.  He fell yesterday and landed on his right elbow.  He has right elbow, shoulder, and neck pain.  He did not hit his head.  No loc.  He is ambulatory.  His neck pain is on the right side of his neck and mainly when he moves his right arm.       Prior to Admission medications   Medication Sig Start Date End Date Taking? Authorizing Provider  amoxicillin -clavulanate (AUGMENTIN ) 875-125 MG tablet Take 1 tablet by mouth every 12 (twelve) hours. 10/09/21   Triplett, Tammy, PA-C  clindamycin  (CLEOCIN ) 150 MG capsule Take by mouth 3 (three) times daily.    [provider]  docusate sodium  (COLACE) 100 MG capsule Cut off the end of capsule and apply to ears as needed for cerumen impaction. 10/19/22   Dean Clarity, MD  HYDROcodone -acetaminophen  (NORCO/VICODIN) 5-325 MG tablet Take one tab po q 4 hrs prn pain 10/09/21   Triplett, Tammy, PA-C  methocarbamol  (ROBAXIN ) 500 MG tablet Take 1 tablet (500 mg total) by mouth every 8 (eight) hours as needed for muscle spasms. 05/09/23   Randol Simmonds, MD  naproxen  (NAPROSYN ) 375 MG tablet Take 1 tablet (375 mg total) by mouth 2 (two) times daily. 05/09/23   Randol Simmonds, MD  promethazine -dextromethorphan (PROMETHAZINE -DM) 6.25-15 MG/5ML syrup Take 5 mLs by mouth 4 (four) times daily as needed. 12/23/22   Stuart Vernell Norris, PA-C    Allergies: Patient has no known allergies.    Review of Systems  Musculoskeletal:  Positive for neck pain.       Right elbow, right shoulder pain  All other systems reviewed and are negative.   Updated Vital Signs BP (!) 122/91   Pulse 78   Temp 97.8 F (36.6 C)   Resp 18   Ht 6' 1 (1.854 m)   Wt 83.9 kg   SpO2 97%   BMI 24.40 kg/m   Physical  Exam Vitals and nursing note reviewed.  Constitutional:      Appearance: Normal appearance.  HENT:     Head: Normocephalic and atraumatic.     Right Ear: External ear normal.     Left Ear: External ear normal.     Nose: Nose normal.     Mouth/Throat:     Mouth: Mucous membranes are moist.     Pharynx: Oropharynx is clear.  Eyes:     Extraocular Movements: Extraocular movements intact.     Conjunctiva/sclera: Conjunctivae normal.     Pupils: Pupils are equal, round, and reactive to light.  Neck:   Cardiovascular:     Rate and Rhythm: Normal rate and regular rhythm.     Pulses: Normal pulses.     Heart sounds: Normal heart sounds.  Pulmonary:     Effort: Pulmonary effort is normal.     Breath sounds: Normal breath sounds.  Abdominal:     General: Abdomen is flat. Bowel sounds are normal.     Palpations: Abdomen is soft.  Musculoskeletal:       Arms:     Cervical back: Normal range of motion and neck supple.  Skin:    General: Skin is warm.     Capillary Refill: Capillary  refill takes less than 2 seconds.  Neurological:     General: No focal deficit present.     Mental Status: He is alert and oriented to person, place, and time.  Psychiatric:        Mood and Affect: Mood normal.        Behavior: Behavior normal.     (all labs ordered are listed, but only abnormal results are displayed) Labs Reviewed - No data to display  EKG: None  Radiology: DG Elbow Complete Right Result Date: 12/27/2023 CLINICAL DATA:  Fall.  Right elbow injury and pain. EXAM: RIGHT ELBOW - COMPLETE 3+ VIEW COMPARISON:  None Available. FINDINGS: There is no evidence of fracture, dislocation, or joint effusion. There is no evidence of arthropathy or other focal bone abnormality. Soft tissues are unremarkable. IMPRESSION: Negative. Electronically Signed   By: Norleen DELENA Kil M.D.   On: 12/27/2023 09:21   DG Shoulder Right Result Date: 12/27/2023 CLINICAL DATA:  Fall from standing and landed on right  elbow. Right elbow and shoulder pain. EXAM: RIGHT SHOULDER - 2+ VIEW COMPARISON:  None Available. FINDINGS: There is no evidence of fracture or dislocation. There is no evidence of arthropathy or other focal bone abnormality. Soft tissues are unremarkable. IMPRESSION: Negative. Electronically Signed   By: Camellia Candle M.D.   On: 12/27/2023 09:05     Procedures   Medications Ordered in the ED - No data to display                                  Medical Decision Making Amount and/or Complexity of Data Reviewed Radiology: ordered.   This patient presents to the ED for concern of fall, this involves an extensive number of treatment options, and is a complaint that carries with it a high risk of complications and morbidity.  The differential diagnosis includes multiple trauma   Co morbidities that complicate the patient evaluation  Asthma and GERD   Additional history obtained:  Additional history obtained from epic chart review  Imaging Studies ordered:  I ordered imaging studies including right shoulder and right elbow  I independently visualized and interpreted imaging which showed  R shoulder: Negative.  R elbow: Negative.  I agree with the radiologist interpretation   Medicines ordered and prescription drug management:   I have reviewed the patients home medicines and have made adjustments as needed   Test Considered:  xr   Problem List / ED Course:  Fall:  nothing broken.  Pt has good ROM.  He is stable for d/c.  Return if worse. F/u with ortho as needed.  He has ibuprofen  at home that he can take if needed.  He did not want anything for pain while here.   Reevaluation:  After the interventions noted above, I reevaluated the patient and found that they have :improved   Social Determinants of Health:  Lives at home   Dispostion:  After consideration of the diagnostic results and the patients response to treatment, I feel that the patent would  benefit from discharge with outpatient f/u.       Final diagnoses:  Fall, initial encounter  Contusion of right elbow, initial encounter  Strain of right shoulder, initial encounter    ED Discharge Orders     None          Dean Clarity, MD 12/27/23 [phone]

## 2023-12-27 NOTE — ED Triage Notes (Signed)
 Pt states he was playing with his kids fell from a standing position landed on right elbow. Has right elbow, shoulder pain and right side neck pain.

## 2024-02-17 ENCOUNTER — Encounter (INDEPENDENT_AMBULATORY_CARE_PROVIDER_SITE_OTHER): Payer: Self-pay | Admitting: Gastroenterology

## 2024-02-24 ENCOUNTER — Ambulatory Visit
Admission: RE | Admit: 2024-02-24 | Discharge: 2024-02-24 | Disposition: A | Discharge status: Home / Self Care | Attending: Family Medicine | Admitting: Family Medicine

## 2024-02-24 VITALS — BP 123/81 | HR 98 | Temp 98.7°F | Resp 20

## 2024-02-24 DIAGNOSIS — M79601 Pain in right arm: Secondary | ICD-10-CM | POA: Diagnosis not present

## 2024-02-24 MED ORDER — NAPROXEN 500 MG PO TABS: 500.0000 mg | ORAL_TABLET | Freq: Two times a day (BID) | ORAL | 0 refills | Status: AC | PRN

## 2024-02-24 MED ORDER — TIZANIDINE HCL 4 MG PO CAPS: 4.0000 mg | ORAL_CAPSULE | Freq: Three times a day (TID) | ORAL | 0 refills | Status: AC | PRN

## 2024-02-24 NOTE — ED Provider Notes (Signed)
 [facility]    CSN: [id number] Arrival date & time: 02/24/24  1350      History   Chief Complaint Chief Complaint  Patient presents with   Arm Injury    Entered by patient    HPI Bobby Ali is a 38 y.o. male.   Patient presenting today with 2-day history of right upper arm pain sometimes radiating to shoulder.  States the pain started after he was cranking some bolts in his car that day.  Denies bruising, swelling, numbness, tingling, loss of range of motion, chest pain, shortness of breath.  So far not trying anything over-the-counter for symptoms.  Pain is worse with active movement.    Past Medical History:  Diagnosis Date   Asthma    GERD (gastroesophageal reflux disease)    Hx of migraine headaches 1998   15 years per patient     Patient Active Problem List   Diagnosis Date Noted   GERD (gastroesophageal reflux disease) 06/06/2013   Pain in joint, shoulder region 08/02/2012   Muscle weakness (generalized) 08/02/2012    Past Surgical History:  Procedure Laterality Date   ESOPHAGOGASTRODUODENOSCOPY N/A 06/16/2013   Procedure: ESOPHAGOGASTRODUODENOSCOPY (EGD);  Surgeon: Claudis RAYMOND Rivet, MD;  Location: [facility];  Service: Endoscopy;  Laterality: N/A;  250-moved to 1155 Ann to notify pt   FINGER FRACTURE SURGERY     PILONIDAL CYST EXCISION         Home Medications    Prior to Admission medications   Medication Sig Start Date End Date Taking? Authorizing Provider  naproxen  (NAPROSYN ) 500 MG tablet Take 1 tablet (500 mg total) by mouth 2 (two) times daily as needed. 02/24/24  Yes Stuart Vernell Norris, PA-C  tiZANidine (ZANAFLEX) 4 MG capsule Take 1 capsule (4 mg total) by mouth 3 (three) times daily as needed for muscle spasms. Do not drink alcohol or drive while taking this medication.  May cause drowsiness. 02/24/24  Yes Stuart Vernell Norris, PA-C  amoxicillin -clavulanate (AUGMENTIN ) 875-125 MG tablet Take 1 tablet by mouth every 12  (twelve) hours. 10/09/21   Triplett, Tammy, PA-C  clindamycin  (CLEOCIN ) 150 MG capsule Take by mouth 3 (three) times daily.    [provider]  docusate sodium  (COLACE) 100 MG capsule Cut off the end of capsule and apply to ears as needed for cerumen impaction. 10/19/22   Dean Clarity, MD  HYDROcodone -acetaminophen  (NORCO/VICODIN) 5-325 MG tablet Take one tab po q 4 hrs prn pain 10/09/21   Triplett, Tammy, PA-C  methocarbamol  (ROBAXIN ) 500 MG tablet Take 1 tablet (500 mg total) by mouth every 8 (eight) hours as needed for muscle spasms. 05/09/23   Randol Simmonds, MD  naproxen  (NAPROSYN ) 375 MG tablet Take 1 tablet (375 mg total) by mouth 2 (two) times daily. 05/09/23   Randol Simmonds, MD  promethazine -dextromethorphan (PROMETHAZINE -DM) 6.25-15 MG/5ML syrup Take 5 mLs by mouth 4 (four) times daily as needed. 12/23/22   Stuart Vernell Norris, PA-C    Family History Family History  Problem Relation Age of Onset   Colon cancer Neg Hx     Social History Social History   Tobacco Use   Smoking status: Every Day    Current packs/day: 1.50    Average packs/day: 1.5 packs/day for 7.0 years (10.5 ttl pk-yrs)    Types: Cigarettes   Smokeless tobacco: Never   Tobacco comments:    x 7 rs.   Vaping Use   Vaping status: Never Used  Substance Use Topics  Alcohol use: Yes    Comment: occasional   Drug use: No     Allergies   Patient has no known allergies.   Review of Systems Review of Systems Per HPI  Physical Exam Triage Vital Signs ED Triage Vitals  Encounter Vitals Group     BP 02/24/24 1417 123/81     Girls Systolic BP Percentile --      Girls Diastolic BP Percentile --      Boys Systolic BP Percentile --      Boys Diastolic BP Percentile --      Pulse Rate 02/24/24 1417 98     Resp 02/24/24 1417 20     Temp 02/24/24 1417 98.7 F (37.1 C)     Temp Source 02/24/24 1417 Oral     SpO2 02/24/24 1417 97 %     Weight --      Height --      Head Circumference --      Peak Flow --       Pain Score 02/24/24 1420 6     Pain Loc --      Pain Education --      Exclude from Growth Chart --    No data found.  Updated Vital Signs BP 123/81 (BP Location: Right Arm)   Pulse 98   Temp 98.7 F (37.1 C) (Oral)   Resp 20   SpO2 97%   Visual Acuity Right Eye Distance:   Left Eye Distance:   Bilateral Distance:    Right Eye Near:   Left Eye Near:    Bilateral Near:     Physical Exam Vitals and nursing note reviewed.  Constitutional:      Appearance: Normal appearance.  HENT:     Head: Atraumatic.     Nose: Nose normal.     Mouth/Throat:     Mouth: Mucous membranes are moist.  Eyes:     Extraocular Movements: Extraocular movements intact.     Conjunctiva/sclera: Conjunctivae normal.  Cardiovascular:     Rate and Rhythm: Normal rate.  Pulmonary:     Effort: Pulmonary effort is normal.  Musculoskeletal:        General: Tenderness present. No swelling. Normal range of motion.     Cervical back: Normal range of motion and neck supple.     Comments: Minimal tenderness to palpation to the right upper arm into elbow, worse with active use of muscles.  Grip strength full and equal bilateral hands  Skin:    General: Skin is warm and dry.     Findings: No bruising or erythema.  Neurological:     Mental Status: He is oriented to person, place, and time.     Comments: Right upper extremity neurovascularly intact  Psychiatric:        Mood and Affect: Mood normal.        Thought Content: Thought content normal.        Judgment: Judgment normal.      UC Treatments / Results  Labs (all labs ordered are listed, but only abnormal results are displayed) Labs Reviewed - No data to display  EKG   Radiology No results found.  Procedures Procedures (including critical care time)  Medications Ordered in UC Medications - No data to display  Initial Impression / Assessment and Plan / UC Course  I have reviewed the triage vital signs and the nursing  notes.  Pertinent labs & imaging results that were available during my care of the  patient were reviewed by me and considered in my medical decision making (see chart for details).     Suspect tendinitis of the right upper arm.  Treat with Zanaflex, naproxen , heat, soft, stretches, rest.  Work note given.  Return for worsening symptoms. Final Clinical Impressions(s) / UC Diagnoses   Final diagnoses:  Right arm pain     Discharge Instructions      Suspect some tendinitis of the arm to be causing your pain.  Avoid overuse or repetitive motions with this arm until feeling much better and I have prescribed an anti-inflammatory pain medication, muscle relaxer and you may use heat, massage, muscle rubs, stretches.  Follow-up for worsening or unresolving symptoms.     ED Prescriptions     Medication Sig Dispense Auth. Provider   tiZANidine (ZANAFLEX) 4 MG capsule Take 1 capsule (4 mg total) by mouth 3 (three) times daily as needed for muscle spasms. Do not drink alcohol or drive while taking this medication.  May cause drowsiness. 15 capsule Stuart Vernell Norris, PA-C   naproxen  (NAPROSYN ) 500 MG tablet Take 1 tablet (500 mg total) by mouth 2 (two) times daily as needed. 20 tablet Stuart Vernell Norris, [place]      PDMP not reviewed this encounter.   Stuart Vernell Norris, PA-C 02/24/24 1445

## 2024-02-24 NOTE — Discharge Instructions (Signed)
 Suspect some tendinitis of the arm to be causing your pain.  Avoid overuse or repetitive motions with this arm until feeling much better and I have prescribed an anti-inflammatory pain medication, muscle relaxer and you may use heat, massage, muscle rubs, stretches.  Follow-up for worsening or unresolving symptoms.

## 2024-02-24 NOTE — ED Triage Notes (Signed)
 Pt reports right arm and shoulder pain since Monday, states he was cranking some bolts in his car and that's when the pain started. Now when ever he does a forward motion he has pain.
